# Patient Record
Sex: Female | Born: 1935 | Race: White | Hispanic: No | Marital: Single | Smoking: Never smoker
Health system: Southern US, Community
[De-identification: ages and names within clinical notes are randomized; demographics above are authoritative.]

## PROBLEM LIST (undated history)

## (undated) DIAGNOSIS — C801 Malignant (primary) neoplasm, unspecified: Secondary | ICD-10-CM

## (undated) DIAGNOSIS — I10 Essential (primary) hypertension: Secondary | ICD-10-CM

## (undated) HISTORY — PX: EYE SURGERY: SHX253

## (undated) HISTORY — PX: BREAST LUMPECTOMY: SHX2

---

## 2003-11-27 ENCOUNTER — Other Ambulatory Visit: Payer: Self-pay

## 2004-09-12 ENCOUNTER — Ambulatory Visit: Payer: Self-pay | Admitting: Oncology

## 2004-09-20 ENCOUNTER — Ambulatory Visit: Payer: Self-pay | Admitting: Oncology

## 2004-10-01 ENCOUNTER — Ambulatory Visit: Payer: Self-pay | Admitting: Family Medicine

## 2005-01-03 ENCOUNTER — Ambulatory Visit: Payer: Self-pay | Admitting: Oncology

## 2005-03-14 ENCOUNTER — Ambulatory Visit: Payer: Self-pay | Admitting: Oncology

## 2005-03-27 ENCOUNTER — Emergency Department: Payer: Self-pay | Admitting: Unknown Physician Specialty

## 2005-09-10 ENCOUNTER — Ambulatory Visit: Payer: Self-pay | Admitting: Oncology

## 2006-01-06 ENCOUNTER — Ambulatory Visit: Payer: Self-pay | Admitting: Family Medicine

## 2007-01-12 ENCOUNTER — Ambulatory Visit: Payer: Self-pay | Admitting: Family Medicine

## 2008-01-20 ENCOUNTER — Ambulatory Visit: Payer: Self-pay | Admitting: Family Medicine

## 2009-02-02 ENCOUNTER — Ambulatory Visit: Payer: Self-pay | Admitting: Family Medicine

## 2010-02-06 ENCOUNTER — Ambulatory Visit: Payer: Self-pay | Admitting: Family Medicine

## 2010-02-13 ENCOUNTER — Ambulatory Visit: Payer: Self-pay | Admitting: Family Medicine

## 2011-02-11 ENCOUNTER — Ambulatory Visit: Payer: Self-pay | Admitting: Surgery

## 2011-03-06 ENCOUNTER — Ambulatory Visit: Payer: Self-pay | Admitting: Surgery

## 2011-03-19 ENCOUNTER — Ambulatory Visit: Payer: Self-pay | Admitting: Surgery

## 2011-04-01 ENCOUNTER — Ambulatory Visit: Payer: Self-pay | Admitting: Oncology

## 2011-04-04 ENCOUNTER — Ambulatory Visit: Payer: Self-pay | Admitting: Oncology

## 2011-04-12 ENCOUNTER — Ambulatory Visit: Payer: Self-pay | Admitting: Surgery

## 2011-04-19 ENCOUNTER — Ambulatory Visit: Payer: Self-pay | Admitting: Surgery

## 2011-04-21 ENCOUNTER — Ambulatory Visit: Payer: Self-pay | Admitting: Oncology

## 2011-04-22 LAB — PATHOLOGY REPORT

## 2011-05-22 ENCOUNTER — Ambulatory Visit: Payer: Self-pay | Admitting: Oncology

## 2011-11-07 ENCOUNTER — Ambulatory Visit: Payer: Self-pay | Admitting: Oncology

## 2011-11-07 LAB — CBC CANCER CENTER
Basophil #: 0 x10 3/mm (ref 0.0–0.1)
Basophil %: 0.5 %
Eosinophil %: 2.2 %
HCT: 45.1 % (ref 35.0–47.0)
HGB: 15.1 g/dL (ref 12.0–16.0)
Lymphocyte #: 1.6 x10 3/mm (ref 1.0–3.6)
MCH: 28.6 pg (ref 26.0–34.0)
MCV: 86 fL (ref 80–100)
Monocyte %: 8.7 %
Neutrophil #: 5.9 x10 3/mm (ref 1.4–6.5)
Platelet: 208 x10 3/mm (ref 150–440)
RDW: 13.9 % (ref 11.5–14.5)
WBC: 8.4 x10 3/mm (ref 3.6–11.0)

## 2011-11-07 LAB — COMPREHENSIVE METABOLIC PANEL
Albumin: 3.9 g/dL (ref 3.4–5.0)
Alkaline Phosphatase: 162 U/L — ABNORMAL HIGH (ref 50–136)
Bilirubin,Total: 0.8 mg/dL (ref 0.2–1.0)
Calcium, Total: 9 mg/dL (ref 8.5–10.1)
Creatinine: 1.07 mg/dL (ref 0.60–1.30)
EGFR (African American): >60
EGFR (Non-African Amer.): 51 — ABNORMAL LOW
Glucose: 95 mg/dL (ref 65–99)
Osmolality: 289 (ref 275–301)
SGPT (ALT): 17 U/L
Sodium: 143 mmol/L (ref 136–145)

## 2011-11-08 LAB — CANCER ANTIGEN 27.29: CA 27.29: 28.1 U/mL (ref 0.0–38.6)

## 2011-11-22 ENCOUNTER — Ambulatory Visit: Payer: Self-pay | Admitting: Oncology

## 2012-02-12 ENCOUNTER — Ambulatory Visit: Payer: Self-pay | Admitting: Oncology

## 2012-05-06 ENCOUNTER — Ambulatory Visit: Payer: Self-pay | Admitting: Oncology

## 2012-05-06 LAB — CBC CANCER CENTER
Basophil #: 0 x10 3/mm (ref 0.0–0.1)
Basophil %: 0.5 %
Eosinophil #: 0.2 x10 3/mm (ref 0.0–0.7)
HCT: 44.7 % (ref 35.0–47.0)
HGB: 14.6 g/dL (ref 12.0–16.0)
Lymphocyte #: 1.1 x10 3/mm (ref 1.0–3.6)
MCH: 27.9 pg (ref 26.0–34.0)
MCHC: 32.7 g/dL (ref 32.0–36.0)
MCV: 86 fL (ref 80–100)
Monocyte #: 0.3 x10 3/mm (ref 0.2–0.9)
Neutrophil #: 3.2 x10 3/mm (ref 1.4–6.5)

## 2012-05-06 LAB — COMPREHENSIVE METABOLIC PANEL
Albumin: 3.8 g/dL (ref 3.4–5.0)
Alkaline Phosphatase: 141 U/L — ABNORMAL HIGH (ref 50–136)
BUN: 19 mg/dL — ABNORMAL HIGH (ref 7–18)
Calcium, Total: 9.3 mg/dL (ref 8.5–10.1)
EGFR (African American): >60
EGFR (Non-African Amer.): 52 — ABNORMAL LOW
Glucose: 152 mg/dL — ABNORMAL HIGH (ref 65–99)
Potassium: 3.3 mmol/L — ABNORMAL LOW (ref 3.5–5.1)
SGOT(AST): 24 U/L (ref 15–37)
SGPT (ALT): 19 U/L
Sodium: 143 mmol/L (ref 136–145)
Total Protein: 7 g/dL (ref 6.4–8.2)

## 2012-05-21 ENCOUNTER — Ambulatory Visit: Payer: Self-pay | Admitting: Oncology

## 2013-02-05 ENCOUNTER — Ambulatory Visit: Payer: Self-pay | Admitting: Internal Medicine

## 2013-02-15 ENCOUNTER — Ambulatory Visit: Payer: Self-pay | Admitting: Internal Medicine

## 2013-02-15 LAB — CREATININE, SERUM
Creatinine: 0.91 mg/dL (ref 0.60–1.30)
EGFR (African American): >60
EGFR (Non-African Amer.): 55 — ABNORMAL LOW

## 2013-02-18 ENCOUNTER — Ambulatory Visit: Payer: Self-pay | Admitting: Oncology

## 2013-02-19 ENCOUNTER — Ambulatory Visit: Payer: Self-pay | Admitting: Oncology

## 2013-02-22 LAB — COMPREHENSIVE METABOLIC PANEL
Alkaline Phosphatase: 152 U/L — ABNORMAL HIGH (ref 50–136)
Anion Gap: 14 (ref 7–16)
BUN: 22 mg/dL — ABNORMAL HIGH (ref 7–18)
Bilirubin,Total: 0.7 mg/dL (ref 0.2–1.0)
Calcium, Total: 9.3 mg/dL (ref 8.5–10.1)
Chloride: 104 mmol/L (ref 98–107)
EGFR (African American): 52 — ABNORMAL LOW
EGFR (Non-African Amer.): 45 — ABNORMAL LOW
Glucose: 144 mg/dL — ABNORMAL HIGH (ref 65–99)
Osmolality: 293 (ref 275–301)
SGOT(AST): 24 U/L (ref 15–37)
SGPT (ALT): 18 U/L (ref 12–78)
Sodium: 144 mmol/L (ref 136–145)
Total Protein: 6.9 g/dL (ref 6.4–8.2)

## 2013-02-22 LAB — CBC CANCER CENTER
Basophil #: 0 x10 3/mm (ref 0.0–0.1)
Eosinophil #: 0.1 x10 3/mm (ref 0.0–0.7)
HCT: 41.4 % (ref 35.0–47.0)
HGB: 14.1 g/dL (ref 12.0–16.0)
Lymphocyte #: 1.4 x10 3/mm (ref 1.0–3.6)
Lymphocyte %: 24.7 %
MCHC: 34.1 g/dL (ref 32.0–36.0)
MCV: 84 fL (ref 80–100)
Monocyte %: 6.4 %
Neutrophil #: 3.7 x10 3/mm (ref 1.4–6.5)
Neutrophil %: 65.6 %
RBC: 4.94 10*6/uL (ref 3.80–5.20)
WBC: 5.6 x10 3/mm (ref 3.6–11.0)

## 2013-02-23 LAB — CANCER ANTIGEN 27.29: CA 27.29: 29.3 U/mL (ref 0.0–38.6)

## 2013-03-21 ENCOUNTER — Ambulatory Visit: Payer: Self-pay | Admitting: Oncology

## 2016-03-01 ENCOUNTER — Inpatient Hospital Stay
Admission: EM | Admit: 2016-03-01 | Discharge: 2016-03-05 | DRG: 481 | Disposition: A | Discharge status: Skilled Nursing Facility | Payer: Medicare Other | Attending: Internal Medicine | Admitting: Internal Medicine

## 2016-03-01 ENCOUNTER — Encounter: Payer: Self-pay | Admitting: Emergency Medicine

## 2016-03-01 ENCOUNTER — Emergency Department: Payer: Medicare Other

## 2016-03-01 DIAGNOSIS — Z859 Personal history of malignant neoplasm, unspecified: Secondary | ICD-10-CM | POA: Diagnosis not present

## 2016-03-01 DIAGNOSIS — I10 Essential (primary) hypertension: Secondary | ICD-10-CM | POA: Diagnosis present

## 2016-03-01 DIAGNOSIS — Z8249 Family history of ischemic heart disease and other diseases of the circulatory system: Secondary | ICD-10-CM | POA: Diagnosis not present

## 2016-03-01 DIAGNOSIS — S72141A Displaced intertrochanteric fracture of right femur, initial encounter for closed fracture: Principal | ICD-10-CM | POA: Diagnosis present

## 2016-03-01 DIAGNOSIS — F419 Anxiety disorder, unspecified: Secondary | ICD-10-CM | POA: Diagnosis present

## 2016-03-01 DIAGNOSIS — D62 Acute posthemorrhagic anemia: Secondary | ICD-10-CM

## 2016-03-01 DIAGNOSIS — M7989 Other specified soft tissue disorders: Secondary | ICD-10-CM

## 2016-03-01 DIAGNOSIS — Z9889 Other specified postprocedural states: Secondary | ICD-10-CM

## 2016-03-01 DIAGNOSIS — M810 Age-related osteoporosis without current pathological fracture: Secondary | ICD-10-CM | POA: Diagnosis present

## 2016-03-01 DIAGNOSIS — Z853 Personal history of malignant neoplasm of breast: Secondary | ICD-10-CM

## 2016-03-01 DIAGNOSIS — W010XXA Fall on same level from slipping, tripping and stumbling without subsequent striking against object, initial encounter: Secondary | ICD-10-CM | POA: Diagnosis present

## 2016-03-01 DIAGNOSIS — Y92019 Unspecified place in single-family (private) house as the place of occurrence of the external cause: Secondary | ICD-10-CM | POA: Diagnosis not present

## 2016-03-01 DIAGNOSIS — S72001A Fracture of unspecified part of neck of right femur, initial encounter for closed fracture: Secondary | ICD-10-CM

## 2016-03-01 DIAGNOSIS — Z9289 Personal history of other medical treatment: Secondary | ICD-10-CM

## 2016-03-01 DIAGNOSIS — S022XXA Fracture of nasal bones, initial encounter for closed fracture: Secondary | ICD-10-CM

## 2016-03-01 DIAGNOSIS — R000 Tachycardia, unspecified: Secondary | ICD-10-CM

## 2016-03-01 DIAGNOSIS — S72009A Fracture of unspecified part of neck of unspecified femur, initial encounter for closed fracture: Secondary | ICD-10-CM

## 2016-03-01 DIAGNOSIS — Z79899 Other long term (current) drug therapy: Secondary | ICD-10-CM | POA: Diagnosis not present

## 2016-03-01 DIAGNOSIS — W19XXXA Unspecified fall, initial encounter: Secondary | ICD-10-CM

## 2016-03-01 DIAGNOSIS — R609 Edema, unspecified: Secondary | ICD-10-CM

## 2016-03-01 HISTORY — DX: Essential (primary) hypertension: I10

## 2016-03-01 HISTORY — DX: Malignant (primary) neoplasm, unspecified: C80.1

## 2016-03-01 LAB — COMPREHENSIVE METABOLIC PANEL
ALT: 14 U/L (ref 14–54)
AST: 32 U/L (ref 15–41)
Albumin: 3.7 g/dL (ref 3.5–5.0)
Alkaline Phosphatase: 86 U/L (ref 38–126)
Anion gap: 9 (ref 5–15)
BUN: 28 mg/dL — ABNORMAL HIGH (ref 6–20)
CHLORIDE: 107 mmol/L (ref 101–111)
CO2: 26 mmol/L (ref 22–32)
CREATININE: 1.01 mg/dL — AB (ref 0.44–1.00)
Calcium: 8.8 mg/dL — ABNORMAL LOW (ref 8.9–10.3)
GFR, EST AFRICAN AMERICAN: 53 mL/min — AB (ref >60)
GFR, EST NON AFRICAN AMERICAN: 46 mL/min — AB (ref >60)
Glucose, Bld: 131 mg/dL — ABNORMAL HIGH (ref 65–99)
POTASSIUM: 3.4 mmol/L — AB (ref 3.5–5.1)
Sodium: 142 mmol/L (ref 135–145)
Total Bilirubin: 1 mg/dL (ref 0.3–1.2)
Total Protein: 5.9 g/dL — ABNORMAL LOW (ref 6.5–8.1)

## 2016-03-01 LAB — URINALYSIS COMPLETE WITH MICROSCOPIC (ARMC ONLY)
Bilirubin Urine: NEGATIVE
GLUCOSE, UA: NEGATIVE mg/dL
KETONES UR: NEGATIVE mg/dL
Leukocytes, UA: NEGATIVE
NITRITE: NEGATIVE
Protein, ur: NEGATIVE mg/dL
SPECIFIC GRAVITY, URINE: 1.012 (ref 1.005–1.030)
pH: 6 (ref 5.0–8.0)

## 2016-03-01 LAB — CBC WITH DIFFERENTIAL/PLATELET
Basophils Absolute: 0 10*3/uL (ref 0–0.1)
Basophils Relative: 0 %
Eosinophils Absolute: 0.1 10*3/uL (ref 0–0.7)
HCT: 37.5 % (ref 35.0–47.0)
Hemoglobin: 12.6 g/dL (ref 12.0–16.0)
LYMPHS ABS: 0.6 10*3/uL — AB (ref 1.0–3.6)
MCH: 30.1 pg (ref 26.0–34.0)
MCHC: 33.6 g/dL (ref 32.0–36.0)
MCV: 89.6 fL (ref 80.0–100.0)
MONO ABS: 0.6 10*3/uL (ref 0.2–0.9)
Neutro Abs: 12.8 10*3/uL — ABNORMAL HIGH (ref 1.4–6.5)
Neutrophils Relative %: 90 %
PLATELETS: 160 10*3/uL (ref 150–440)
RBC: 4.18 MIL/uL (ref 3.80–5.20)
RDW: 13.7 % (ref 11.5–14.5)
WBC: 14.1 10*3/uL — ABNORMAL HIGH (ref 3.6–11.0)

## 2016-03-01 LAB — ABO/RH: ABO/RH(D): O POS

## 2016-03-01 LAB — APTT: APTT: 27 s (ref 24–36)

## 2016-03-01 LAB — SURGICAL PCR SCREEN
MRSA, PCR: NEGATIVE
Staphylococcus aureus: NEGATIVE

## 2016-03-01 LAB — PROTIME-INR
INR: 0.89
PROTHROMBIN TIME: 12.3 s (ref 11.4–15.0)

## 2016-03-01 MED ORDER — ACETAMINOPHEN 325 MG PO TABS: 650.0000 mg | ORAL_TABLET | Freq: Four times a day (QID) | ORAL | Status: DC | PRN

## 2016-03-01 MED ORDER — FENTANYL CITRATE (PF) 100 MCG/2ML IJ SOLN
50.0000 ug | Freq: Once | INTRAMUSCULAR | Status: AC
  Administered 2016-03-01: 50 ug via INTRAVENOUS
  Filled 2016-03-01: qty 2

## 2016-03-01 MED ORDER — PNEUMOCOCCAL VAC POLYVALENT 25 MCG/0.5ML IJ INJ: 0.5000 mL | INJECTION | INTRAMUSCULAR | Status: DC

## 2016-03-01 MED ORDER — ONDANSETRON HCL 4 MG/2ML IJ SOLN: 4.0000 mg | Freq: Four times a day (QID) | INTRAMUSCULAR | Status: DC | PRN

## 2016-03-01 MED ORDER — IBUPROFEN 400 MG PO TABS: 200.0000 mg | ORAL_TABLET | ORAL | Status: DC | PRN

## 2016-03-01 MED ORDER — ONDANSETRON HCL 4 MG PO TABS: 4.0000 mg | ORAL_TABLET | Freq: Four times a day (QID) | ORAL | Status: DC | PRN

## 2016-03-01 MED ORDER — ANASTROZOLE 1 MG PO TABS
1.0000 mg | ORAL_TABLET | Freq: Every day | ORAL | Status: DC
  Filled 2016-03-01: qty 1

## 2016-03-01 MED ORDER — AMLODIPINE BESYLATE 5 MG PO TABS
2.5000 mg | ORAL_TABLET | Freq: Every day | ORAL | Status: DC
  Administered 2016-03-03: 2.5 mg via ORAL
  Filled 2016-03-01: qty 1

## 2016-03-01 MED ORDER — OXYCODONE HCL 5 MG PO TABS: 5.0000 mg | ORAL_TABLET | ORAL | Status: DC | PRN

## 2016-03-01 MED ORDER — MORPHINE SULFATE (PF) 2 MG/ML IV SOLN
2.0000 mg | Freq: Once | INTRAVENOUS | Status: AC
  Administered 2016-03-01: 2 mg via INTRAVENOUS
  Filled 2016-03-01: qty 1

## 2016-03-01 MED ORDER — CEFAZOLIN SODIUM-DEXTROSE 2-4 GM/100ML-% IV SOLN
2.0000 g | INTRAVENOUS | Status: DC
  Filled 2016-03-01: qty 100

## 2016-03-01 MED ORDER — ACETAMINOPHEN 650 MG RE SUPP: 650.0000 mg | Freq: Four times a day (QID) | RECTAL | Status: DC | PRN

## 2016-03-01 MED ORDER — OXYCODONE HCL 5 MG PO TABS
5.0000 mg | ORAL_TABLET | ORAL | Status: DC | PRN
  Administered 2016-03-01 – 2016-03-02 (×2): 5 mg via ORAL
  Filled 2016-03-01 (×3): qty 1

## 2016-03-01 MED ORDER — DOCUSATE SODIUM 100 MG PO CAPS
100.0000 mg | ORAL_CAPSULE | Freq: Two times a day (BID) | ORAL | Status: DC
  Administered 2016-03-01 (×2): 100 mg via ORAL
  Filled 2016-03-01 (×2): qty 1

## 2016-03-01 MED ORDER — VITAMIN D (ERGOCALCIFEROL) 1.25 MG (50000 UNIT) PO CAPS
50000.0000 [IU] | ORAL_CAPSULE | ORAL | Status: DC
  Filled 2016-03-01: qty 1

## 2016-03-01 MED ORDER — ALPRAZOLAM 0.25 MG PO TABS
0.2500 mg | ORAL_TABLET | Freq: Every evening | ORAL | Status: DC | PRN
  Administered 2016-03-01 – 2016-03-03 (×3): 0.25 mg via ORAL
  Filled 2016-03-01 (×3): qty 1

## 2016-03-01 MED ORDER — POTASSIUM CHLORIDE 20 MEQ PO PACK
40.0000 meq | PACK | Freq: Once | ORAL | Status: AC
  Administered 2016-03-01: 40 meq via ORAL
  Filled 2016-03-01: qty 2

## 2016-03-01 MED ORDER — MORPHINE SULFATE (PF) 2 MG/ML IV SOLN: 2.0000 mg | INTRAVENOUS | Status: DC | PRN

## 2016-03-01 NOTE — ED Notes (Signed)
Report given to Kim, RN.

## 2016-03-01 NOTE — H&P (Signed)
[place] [facility] [place] NAME: Jasmine Stokes    MR#:  [id number]  DATE OF BIRTH:  1918-12-11  DATE OF ADMISSION:  03/01/2016  PRIMARY CARE PHYSICIAN: No primary care provider on file.   REQUESTING/REFERRING PHYSICIAN: Harvest Dark, MD  CHIEF COMPLAINT:  fall  HISTORY OF PRESENT ILLNESS:  Jasmine Stokes  is a 82 y.o. female with a known history of hypertension brought into the emergency department after a fall. According to the patient she tripped this morning falling onto her right side hitting her head. Does not believe she lost consciousness. Patient was unable to stand due to pain in the right hip. Patient also had bleeding from both nostrils, and bruising to her face. Xray has revealed nasal bone fracture. Patient lives alone and takes care of herself. Right hip x-ray with a right intertrochanteric fracture  PAST MEDICAL HISTORY:   Past Medical History  Diagnosis Date  . Hypertension   . Cancer [facility])     PAST SURGICAL HISTOIRY:   Past Surgical History  Procedure Laterality Date  . Breast lumpectomy      bilateral  . Eye surgery      SOCIAL HISTORY:   Social History  Substance Use Topics  . Smoking status: Never Smoker   . Smokeless tobacco: Not on file  . Alcohol Use: No    FAMILY HISTORY:  htn  DRUG ALLERGIES:  No Known Allergies  REVIEW OF SYSTEMS:  CONSTITUTIONAL: No fever, fatigue or weakness EYES: No blurred or double vision.  EARS, NOSE, AND THROAT: No tinnitus or ear pain.  RESPIRATORY: No cough, shortness of breath, wheezing or hemoptysis.  CARDIOVASCULAR: No chest pain, orthopnea, edema.  GASTROINTESTINAL: No nausea, vomiting, diarrhea or abdominal pain.  GENITOURINARY: No dysuria, hematuria.  ENDOCRINE: No polyuria, nocturia,  HEMATOLOGY: No anemia, easy bruising or bleeding SKIN: No rash or lesion. Bruises on her face MUSCULOSKELETAL: Right hip pain No joint pain or arthritis.   NEUROLOGIC:  No tingling, numbness, weakness.  PSYCHIATRY: No anxiety or depression.   MEDICATIONS AT HOME:   Prior to Admission medications   Medication Sig Start Date End Date Taking? Authorizing Provider  ALPRAZolam Duanne Moron) 0.25 MG tablet Take 1-2 tablets by mouth at bedtime as needed. 12/18/15  Yes Historical Provider, MD  amLODipine (NORVASC) 2.5 MG tablet Take 1 tablet by mouth daily. 02/12/16  Yes Historical Provider, MD  anastrozole (ARIMIDEX) 1 MG tablet Take 1 tablet by mouth daily. Reported on 03/01/2016    Historical Provider, MD  ibuprofen (ADVIL,MOTRIN) 200 MG tablet Take 1 tablet by mouth every 6 (six) hours as needed. Reported on 03/01/2016    Historical Provider, MD  Vitamin D, Ergocalciferol, (DRISDOL) 50000 units CAPS capsule Take 1 capsule by mouth once a week. Reported on 03/01/2016    Historical Provider, MD      VITAL SIGNS:  Blood pressure 153/98, pulse 101, temperature 98.2 F (36.8 C), temperature source Axillary, resp. rate 20, height 5' (1.524 m), weight 39.917 kg (88 lb), SpO2 99 %.  PHYSICAL EXAMINATION:  GENERAL:  82 y.o.-year-old patient lying in the bed with no acute distress. Patient is hard of hearing EYES: Pupils equal, round, reactive to light and accommodation. No scleral icterus. Extraocular muscles intact.  HEENT: Head atraumatic, normocephalic. Oropharynx and nasopharynx clear.  NECK:  Supple, no jugular venous distention. No thyroid enlargement, no tenderness.  LUNGS: Normal breath sounds bilaterally, no wheezing, rales,rhonchi or crepitation. No use of accessory muscles of respiration.  CARDIOVASCULAR: S1, S2 normal. No murmurs, rubs, or gallops.  ABDOMEN: Soft, nontender, nondistended. Bowel sounds present. No organomegaly or mass.  EXTREMITIES: Right hip is tender to touch, abducted and internally rotated .No pedal edema, cyanosis, or clubbing.  NEUROLOGIC: Cranial nerves II through XII are intact. Muscle strength 5/5 in all extremities except right lower  extremity Sensation intact. Gait not checked.  PSYCHIATRIC: The patient is alert and oriented x 3.  SKIN: No obvious rash, lesion, or ulcer.   LABORATORY PANEL:   CBC  Recent Labs Lab 03/01/16 0957  WBC 14.1*  HGB 12.6  HCT 37.5  PLT 160   ------------------------------------------------------------------------------------------------------------------  Chemistries   Recent Labs Lab 03/01/16 0957  NA 142  K 3.4*  CL 107  CO2 26  GLUCOSE 131*  BUN 28*  CREATININE 1.01*  CALCIUM 8.8*  AST 32  ALT 14  ALKPHOS 86  BILITOT 1.0   ------------------------------------------------------------------------------------------------------------------  Cardiac Enzymes No results for input(s): TROPONINI in the last 168 hours. ------------------------------------------------------------------------------------------------------------------  RADIOLOGY:  Dg Chest 1 View  03/01/2016  CLINICAL DATA:  Pain following fall EXAM: CHEST 1 VIEW COMPARISON:  None. FINDINGS: No edema or consolidation. Heart size and pulmonary vascularity are normal. No adenopathy. There is atherosclerotic calcification in the aorta. No pneumothorax. No fractures evident. Bones osteoporotic. There is calcification in each carotid artery. IMPRESSION: No edema or consolidation. No pneumothorax. Areas of carotid artery and thoracic aortic calcification. Bones osteoporotic. Electronically Signed   By: Lowella Grip III M.D.   On: 03/01/2016 10:37   Ct Head Wo Contrast  03/01/2016  CLINICAL DATA:  Status post fall this morning with a blow to the head. The patient's walker slipped out from under her. Initial encounter. EXAM: CT HEAD WITHOUT CONTRAST CT MAXILLOFACIAL WITHOUT CONTRAST CT CERVICAL SPINE WITHOUT CONTRAST TECHNIQUE: Multidetector CT imaging of the head, cervical spine, and maxillofacial structures were performed using the standard protocol without intravenous contrast. Multiplanar CT image reconstructions  of the cervical spine and maxillofacial structures were also generated. COMPARISON:  Head CT scan 02/05/2013. FINDINGS: CT HEAD FINDINGS Cortical atrophy and chronic microvascular ischemic change are seen. No evidence of acute intracranial abnormality including hemorrhage, infarct, midline shift or abnormal extra-axial fluid collection. Calcified extra-axial lesion over the high right frontal convexities measuring 0.4 cm in diameter is unchanged and may be a tiny meningioma. No hydrocephalus or pneumocephalus. The calvarium is intact. CT MAXILLOFACIAL FINDINGS The patient has a minimally depressed right nasal bone fracture. There is also minimally depressed fracture of the anterior wall of the right maxillary sinus. No other fracture is identified. There is a tiny amount of hemorrhage in the right maxillary sinus. Mucosal thickening right sphenoid sinus is noted. Mandibular condyles are located. The patient is status post bilateral lens extraction. The globes are intact and orbital fat is clear. Soft tissue contusion over the right maxilla is identified. CT CERVICAL SPINE FINDINGS No fracture or malalignment cervical spine is identified. Mild loss of disc space height is seen at C4-5 and C6-7. Lung apices are clear. IMPRESSION: Minimally depressed fractures of the right nasal bone in the anterior wall of the right maxillary sinus. Soft tissue contusion over the right maxilla and tiny amount of hemorrhage in the right maxillary sinus are identified. No acute abnormality head or cervical spine. Atrophy and chronic microvascular ischemic change. Electronically Signed   By: Inge Rise M.D.   On: 03/01/2016 11:06   Ct Cervical Spine Wo Contrast  03/01/2016  CLINICAL DATA:  Status post fall  this morning with a blow to the head. The patient's walker slipped out from under her. Initial encounter. EXAM: CT HEAD WITHOUT CONTRAST CT MAXILLOFACIAL WITHOUT CONTRAST CT CERVICAL SPINE WITHOUT CONTRAST TECHNIQUE:  Multidetector CT imaging of the head, cervical spine, and maxillofacial structures were performed using the standard protocol without intravenous contrast. Multiplanar CT image reconstructions of the cervical spine and maxillofacial structures were also generated. COMPARISON:  Head CT scan 02/05/2013. FINDINGS: CT HEAD FINDINGS Cortical atrophy and chronic microvascular ischemic change are seen. No evidence of acute intracranial abnormality including hemorrhage, infarct, midline shift or abnormal extra-axial fluid collection. Calcified extra-axial lesion over the high right frontal convexities measuring 0.4 cm in diameter is unchanged and may be a tiny meningioma. No hydrocephalus or pneumocephalus. The calvarium is intact. CT MAXILLOFACIAL FINDINGS The patient has a minimally depressed right nasal bone fracture. There is also minimally depressed fracture of the anterior wall of the right maxillary sinus. No other fracture is identified. There is a tiny amount of hemorrhage in the right maxillary sinus. Mucosal thickening right sphenoid sinus is noted. Mandibular condyles are located. The patient is status post bilateral lens extraction. The globes are intact and orbital fat is clear. Soft tissue contusion over the right maxilla is identified. CT CERVICAL SPINE FINDINGS No fracture or malalignment cervical spine is identified. Mild loss of disc space height is seen at C4-5 and C6-7. Lung apices are clear. IMPRESSION: Minimally depressed fractures of the right nasal bone in the anterior wall of the right maxillary sinus. Soft tissue contusion over the right maxilla and tiny amount of hemorrhage in the right maxillary sinus are identified. No acute abnormality head or cervical spine. Atrophy and chronic microvascular ischemic change. Electronically Signed   By: Inge Rise M.D.   On: 03/01/2016 11:06   Dg Hip Unilat  With Pelvis 2-3 Views Right  03/01/2016  CLINICAL DATA:  Pain following fall EXAM: DG HIP  (WITH OR WITHOUT PELVIS) 2-3V RIGHT COMPARISON:  None. FINDINGS: Frontal pelvis as well as frontal and lateral right hip images were obtained. There is a comminuted intertrochanteric femur fracture on the right with varus angulation fracture site. There is avulsion of the lesser trochanter. There is impaction at the fracture site with multiple displaced fracture fragments throughout the intertrochanteric region. No other fractures. No dislocation. There is mild symmetric narrowing of both hip joints. Bones are diffusely osteoporotic. IMPRESSION: Comminuted intertrochanteric femur fracture on the right with displaced fracture fragments as well as impaction and varus angulation at the fracture site. No other fractures. No dislocations. Narrowing both hip joints, symmetric. Bones diffusely osteoporotic. Electronically Signed   By: Lowella Grip III M.D.   On: 03/01/2016 10:36   Ct Maxillofacial Wo Cm  03/01/2016  CLINICAL DATA:  Status post fall this morning with a blow to the head. The patient's walker slipped out from under her. Initial encounter. EXAM: CT HEAD WITHOUT CONTRAST CT MAXILLOFACIAL WITHOUT CONTRAST CT CERVICAL SPINE WITHOUT CONTRAST TECHNIQUE: Multidetector CT imaging of the head, cervical spine, and maxillofacial structures were performed using the standard protocol without intravenous contrast. Multiplanar CT image reconstructions of the cervical spine and maxillofacial structures were also generated. COMPARISON:  Head CT scan 02/05/2013. FINDINGS: CT HEAD FINDINGS Cortical atrophy and chronic microvascular ischemic change are seen. No evidence of acute intracranial abnormality including hemorrhage, infarct, midline shift or abnormal extra-axial fluid collection. Calcified extra-axial lesion over the high right frontal convexities measuring 0.4 cm in diameter is unchanged and may be a tiny meningioma.  No hydrocephalus or pneumocephalus. The calvarium is intact. CT MAXILLOFACIAL FINDINGS The  patient has a minimally depressed right nasal bone fracture. There is also minimally depressed fracture of the anterior wall of the right maxillary sinus. No other fracture is identified. There is a tiny amount of hemorrhage in the right maxillary sinus. Mucosal thickening right sphenoid sinus is noted. Mandibular condyles are located. The patient is status post bilateral lens extraction. The globes are intact and orbital fat is clear. Soft tissue contusion over the right maxilla is identified. CT CERVICAL SPINE FINDINGS No fracture or malalignment cervical spine is identified. Mild loss of disc space height is seen at C4-5 and C6-7. Lung apices are clear. IMPRESSION: Minimally depressed fractures of the right nasal bone in the anterior wall of the right maxillary sinus. Soft tissue contusion over the right maxilla and tiny amount of hemorrhage in the right maxillary sinus are identified. No acute abnormality head or cervical spine. Atrophy and chronic microvascular ischemic change. Electronically Signed   By: Inge Rise M.D.   On: 03/01/2016 11:06    EKG:   Orders placed or performed during the hospital encounter of 03/01/16  . ED EKG  . ED EKG    IMPRESSION AND PLAN:   Jasmine Stokes  is a 82 y.o. female with a known history of hypertension brought into the emergency department after a fall. According to the patient she tripped this morning falling onto her right side hitting her head. Does not believe she lost consciousness. Patient was unable to stand due to pain in the right hip. Patient also had bleeding from both nostrils, and bruising to her face. Xray has revealed nasal bone fracture. Patient lives alone and takes care of herself. Right hip x-ray with a right intertrochanteric fracture  #Right hip intertrochanteric fracture Medically optimized for surgery, patient did not have any cardiac history, lives alone and takes care of herself Orthopedics consult is placed and discussed with Dr.  Mack Guise, might consider doing surgery in a.m. Pain management as needed PT evaluation after surgery  #Essential hypertension Resume home medication amlodipine and titrate as needed  #History of anxiety resume home medications Xanax  #Nasal bone fracture from the fall Pain management as needed  #History of breast cancer status post lumpectomy currently under remission no interventions needed at this time      All the records are reviewed and case discussed with ED provider. Management plans discussed with the patient, family and they are in agreement.  CODE STATUS: [id number]  TOTAL TIME TAKING CARE OF THIS PATIENT: 45 minutes.    Nicholes Mango M.D on 03/01/2016 at 2:43 PM  Between 7am to 6pm - Pager - [phone]  After 6pm go to [email or web address] - password [email or web address] [facility]  [facility]  Office  [phone]  CC: Primary care physician; No primary care provider on file.

## 2016-03-01 NOTE — ED Provider Notes (Signed)
[facility] Emergency Department Provider Note  Time seen: 9:57 AM  I have reviewed the triage vital signs and the nursing notes.   HISTORY  Chief Complaint Fall    HPI Jasmine Stokes is a 82 y.o. female with a past medical history of hypertension who presents the emergency department after a fall. According to the patient she tripped this morning falling onto her right side hitting her head. Does not believe she lost consciousness. Patient was unable to stand due to pain in the right hip. Patient also had bleeding from both nostrils, and bruising to her face. Patient was able to crawl to a phone to call 911 to bring her to the emergency department.     Past Medical History  Diagnosis Date  . Hypertension   . Cancer ([place])     There are no active problems to display for this patient.   Past Surgical History  Procedure Laterality Date  . Breast lumpectomy      bilateral  . Eye surgery      No current outpatient prescriptions on file.  Allergies Review of patient's allergies indicates no known allergies.  No family history on file.  Social History Social History  Substance Use Topics  . Smoking status: Never Smoker   . Smokeless tobacco: None  . Alcohol Use: No    Review of Systems Constitutional: Negative for fever. Eyes: Negative for visual changes. ENT: Positive for blood from the nose earlier which has resolved Cardiovascular: Negative for chest pain. Respiratory: Negative for shortness of breath. Gastrointestinal: Negative for abdominal pain Musculoskeletal: Right hip pain. Skin: Ecchymosis to the face. Neurological: Negative for headache. Denies focal weakness or numbness. 10-point ROS otherwise negative.  ____________________________________________   PHYSICAL EXAM:  VITAL SIGNS: ED Triage Vitals  Enc Vitals Group     BP 03/01/16 0944 172/67 mmHg     Pulse Rate 03/01/16 0944 96     Resp --      Temp 03/01/16 0944  98.4 F (36.9 C)     Temp Source 03/01/16 0944 Oral     SpO2 03/01/16 0944 98 %     Weight 03/01/16 0944 88 lb (39.917 kg)     Height 03/01/16 0944 5' (1.524 m)     Head Cir --      Peak Flow --      Pain Score --      Pain Loc --      Pain Edu? --      Excl. in [place]? --     Constitutional: Alert and oriented. Well appearing and in no distress. Eyes: Mild right periorbital ecchymosis, EOMI. ENT   Head: Ecchymosis to forehead, right periorbital area and nose. Dried blood in bilateral nostrils, no septal hematoma or active bleed.   Mouth/Throat: Mucous membranes are moist. Cardiovascular: Normal rate, regular rhythm. No murmur Respiratory: Normal respiratory effort without tachypnea nor retractions. Breath sounds are clear  Gastrointestinal: Soft and nontender. No distention. Musculoskeletal: Shortness of extremity rotated right lower extremity, with moderate right hip tenderness to palpation. Left hip and lower extremity. Normal. Neurovascular intact bilaterally.Sensation intact. 2+ DP pulse. Neurologic:  Normal speech and language. No gross focal neurologic deficits. Sensation intact in all extremities. Right lower extremity unable to test due to pain, likely fracture. Skin:  Skin is warm, dry  Psychiatric: Mood and affect are normal.  ____________________________________________    EKG  EKG reviewed and interpreted by myself shows normal sinus rhythm at 94 bpm, narrow  QRS, normal axis, normal intervals, nonspecific but no concerning ST changes.  ____________________________________________    RADIOLOGY  X-ray consistent with right intertrochanteric femur fracture.  ____________________________________________    INITIAL IMPRESSION / ASSESSMENT AND PLAN / ED COURSE  Pertinent labs & imaging results that were available during my care of the patient were reviewed by me and considered in my medical decision making (see chart for details).  Patient presents the  emergency department after a fall. Exams consistent with right hip fracture. We will check labs, x-rays, CT head/face/neck due to fall and facial ecchymosis. We will check preop labs as well as a preop chest x-ray and EKG. Patient denies any pain when lying still, but states significant pain with any attempted movement of the right lower extremity.   X-ray consistent with right intertrochanteric femur fracture. We'll discuss with orthopedics and admit. Foley catheter placed. Labs are largely within normal limits. ____________________________________________   FINAL CLINICAL IMPRESSION(S) / ED DIAGNOSES  Cheral Marker, MD 03/01/16 1110

## 2016-03-01 NOTE — ED Notes (Signed)
Patient brought in by [facility] from home for a fall this morning, patient states that her walked slide out from under her and she fell hitting her head. Patient has shortening and rotation of the right leg. Patient denies LOC, has hematoma on the right cheek.

## 2016-03-01 NOTE — Consult Note (Addendum)
ORTHOPAEDIC CONSULTATION  REQUESTING PHYSICIAN: Nicholes Mango, MD  Chief Complaint: Right hip pain status post fall  HPI: Jasmine Stokes is a 82 y.o. female who complains of  pain in the right hip after fall at home today. She states she was using her walker and coming out of her 10. The walker, had of her causing her to fall. She was unable to stand or ambulate following this injury. She is brought to the [facility] emergency Department where x-rays were taken and revealed a three-part displaced intertrochanteric hip fracture. Patient was admitted to the hospitalist service for preoperative clearance. She is seen in her hospital room today with family and friends at the bedside. She currently states she is comfortable. She denies any other injuries.  Patient was independent at baseline. CT scan of the head demonstrated fractures of the nasal bone and right axillary sinus.  Past Medical History  Diagnosis Date  . Hypertension   . Cancer [facility])    Past Surgical History  Procedure Laterality Date  . Breast lumpectomy      bilateral  . Eye surgery     Social History   Social History  . Marital Status: Single    Spouse Name: N/A  . Number of Children: N/A  . Years of Education: N/A   Social History Main Topics  . Smoking status: Never Smoker   . Smokeless tobacco: None  . Alcohol Use: No  . Drug Use: No  . Sexual Activity: Not Asked   Other Topics Concern  . None   Social History Narrative  . None   No family history on file. No Known Allergies Prior to Admission medications   Medication Sig Start Date End Date Taking? Authorizing Provider  ALPRAZolam Duanne Moron) 0.25 MG tablet Take 1-2 tablets by mouth at bedtime as needed. 12/18/15  Yes Historical Provider, MD  amLODipine (NORVASC) 2.5 MG tablet Take 1 tablet by mouth daily. 02/12/16  Yes Historical Provider, MD  anastrozole (ARIMIDEX) 1 MG tablet Take 1 tablet by mouth daily. Reported on 03/01/2016    Historical  Provider, MD  ibuprofen (ADVIL,MOTRIN) 200 MG tablet Take 1 tablet by mouth every 6 (six) hours as needed. Reported on 03/01/2016    Historical Provider, MD  Vitamin D, Ergocalciferol, (DRISDOL) 50000 units CAPS capsule Take 1 capsule by mouth once a week. Reported on 03/01/2016    Historical Provider, MD   Dg Chest 1 View  03/01/2016  CLINICAL DATA:  Pain following fall EXAM: CHEST 1 VIEW COMPARISON:  None. FINDINGS: No edema or consolidation. Heart size and pulmonary vascularity are normal. No adenopathy. There is atherosclerotic calcification in the aorta. No pneumothorax. No fractures evident. Bones osteoporotic. There is calcification in each carotid artery. IMPRESSION: No edema or consolidation. No pneumothorax. Areas of carotid artery and thoracic aortic calcification. Bones osteoporotic. Electronically Signed   By: Lowella Grip III M.D.   On: 03/01/2016 10:37   Ct Head Wo Contrast  03/01/2016  CLINICAL DATA:  Status post fall this morning with a blow to the head. The patient's walker slipped out from under her. Initial encounter. EXAM: CT HEAD WITHOUT CONTRAST CT MAXILLOFACIAL WITHOUT CONTRAST CT CERVICAL SPINE WITHOUT CONTRAST TECHNIQUE: Multidetector CT imaging of the head, cervical spine, and maxillofacial structures were performed using the standard protocol without intravenous contrast. Multiplanar CT image reconstructions of the cervical spine and maxillofacial structures were also generated. COMPARISON:  Head CT scan 02/05/2013. FINDINGS: CT HEAD FINDINGS Cortical atrophy and chronic microvascular ischemic change are  seen. No evidence of acute intracranial abnormality including hemorrhage, infarct, midline shift or abnormal extra-axial fluid collection. Calcified extra-axial lesion over the high right frontal convexities measuring 0.4 cm in diameter is unchanged and may be a tiny meningioma. No hydrocephalus or pneumocephalus. The calvarium is intact. CT MAXILLOFACIAL FINDINGS The patient  has a minimally depressed right nasal bone fracture. There is also minimally depressed fracture of the anterior wall of the right maxillary sinus. No other fracture is identified. There is a tiny amount of hemorrhage in the right maxillary sinus. Mucosal thickening right sphenoid sinus is noted. Mandibular condyles are located. The patient is status post bilateral lens extraction. The globes are intact and orbital fat is clear. Soft tissue contusion over the right maxilla is identified. CT CERVICAL SPINE FINDINGS No fracture or malalignment cervical spine is identified. Mild loss of disc space height is seen at C4-5 and C6-7. Lung apices are clear. IMPRESSION: Minimally depressed fractures of the right nasal bone in the anterior wall of the right maxillary sinus. Soft tissue contusion over the right maxilla and tiny amount of hemorrhage in the right maxillary sinus are identified. No acute abnormality head or cervical spine. Atrophy and chronic microvascular ischemic change. Electronically Signed   By: Inge Rise M.D.   On: 03/01/2016 11:06   Ct Cervical Spine Wo Contrast  03/01/2016  CLINICAL DATA:  Status post fall this morning with a blow to the head. The patient's walker slipped out from under her. Initial encounter. EXAM: CT HEAD WITHOUT CONTRAST CT MAXILLOFACIAL WITHOUT CONTRAST CT CERVICAL SPINE WITHOUT CONTRAST TECHNIQUE: Multidetector CT imaging of the head, cervical spine, and maxillofacial structures were performed using the standard protocol without intravenous contrast. Multiplanar CT image reconstructions of the cervical spine and maxillofacial structures were also generated. COMPARISON:  Head CT scan 02/05/2013. FINDINGS: CT HEAD FINDINGS Cortical atrophy and chronic microvascular ischemic change are seen. No evidence of acute intracranial abnormality including hemorrhage, infarct, midline shift or abnormal extra-axial fluid collection. Calcified extra-axial lesion over the high right frontal  convexities measuring 0.4 cm in diameter is unchanged and may be a tiny meningioma. No hydrocephalus or pneumocephalus. The calvarium is intact. CT MAXILLOFACIAL FINDINGS The patient has a minimally depressed right nasal bone fracture. There is also minimally depressed fracture of the anterior wall of the right maxillary sinus. No other fracture is identified. There is a tiny amount of hemorrhage in the right maxillary sinus. Mucosal thickening right sphenoid sinus is noted. Mandibular condyles are located. The patient is status post bilateral lens extraction. The globes are intact and orbital fat is clear. Soft tissue contusion over the right maxilla is identified. CT CERVICAL SPINE FINDINGS No fracture or malalignment cervical spine is identified. Mild loss of disc space height is seen at C4-5 and C6-7. Lung apices are clear. IMPRESSION: Minimally depressed fractures of the right nasal bone in the anterior wall of the right maxillary sinus. Soft tissue contusion over the right maxilla and tiny amount of hemorrhage in the right maxillary sinus are identified. No acute abnormality head or cervical spine. Atrophy and chronic microvascular ischemic change. Electronically Signed   By: Inge Rise M.D.   On: 03/01/2016 11:06   Dg Hip Unilat  With Pelvis 2-3 Views Right  03/01/2016  CLINICAL DATA:  Pain following fall EXAM: DG HIP (WITH OR WITHOUT PELVIS) 2-3V RIGHT COMPARISON:  None. FINDINGS: Frontal pelvis as well as frontal and lateral right hip images were obtained. There is a comminuted intertrochanteric femur fracture on the  right with varus angulation fracture site. There is avulsion of the lesser trochanter. There is impaction at the fracture site with multiple displaced fracture fragments throughout the intertrochanteric region. No other fractures. No dislocation. There is mild symmetric narrowing of both hip joints. Bones are diffusely osteoporotic. IMPRESSION: Comminuted intertrochanteric femur  fracture on the right with displaced fracture fragments as well as impaction and varus angulation at the fracture site. No other fractures. No dislocations. Narrowing both hip joints, symmetric. Bones diffusely osteoporotic. Electronically Signed   By: Lowella Grip III M.D.   On: 03/01/2016 10:36   Ct Maxillofacial Wo Cm  03/01/2016  CLINICAL DATA:  Status post fall this morning with a blow to the head. The patient's walker slipped out from under her. Initial encounter. EXAM: CT HEAD WITHOUT CONTRAST CT MAXILLOFACIAL WITHOUT CONTRAST CT CERVICAL SPINE WITHOUT CONTRAST TECHNIQUE: Multidetector CT imaging of the head, cervical spine, and maxillofacial structures were performed using the standard protocol without intravenous contrast. Multiplanar CT image reconstructions of the cervical spine and maxillofacial structures were also generated. COMPARISON:  Head CT scan 02/05/2013. FINDINGS: CT HEAD FINDINGS Cortical atrophy and chronic microvascular ischemic change are seen. No evidence of acute intracranial abnormality including hemorrhage, infarct, midline shift or abnormal extra-axial fluid collection. Calcified extra-axial lesion over the high right frontal convexities measuring 0.4 cm in diameter is unchanged and may be a tiny meningioma. No hydrocephalus or pneumocephalus. The calvarium is intact. CT MAXILLOFACIAL FINDINGS The patient has a minimally depressed right nasal bone fracture. There is also minimally depressed fracture of the anterior wall of the right maxillary sinus. No other fracture is identified. There is a tiny amount of hemorrhage in the right maxillary sinus. Mucosal thickening right sphenoid sinus is noted. Mandibular condyles are located. The patient is status post bilateral lens extraction. The globes are intact and orbital fat is clear. Soft tissue contusion over the right maxilla is identified. CT CERVICAL SPINE FINDINGS No fracture or malalignment cervical spine is identified. Mild  loss of disc space height is seen at C4-5 and C6-7. Lung apices are clear. IMPRESSION: Minimally depressed fractures of the right nasal bone in the anterior wall of the right maxillary sinus. Soft tissue contusion over the right maxilla and tiny amount of hemorrhage in the right maxillary sinus are identified. No acute abnormality head or cervical spine. Atrophy and chronic microvascular ischemic change. Electronically Signed   By: Inge Rise M.D.   On: 03/01/2016 11:06    Positive ROS: All other systems have been reviewed and were otherwise negative with the exception of those mentioned in the HPI and as above.  Physical Exam: General: Alert, no acute distress. Patient has a significant contusion over the right side of her face extending for head and periorbital area.  MUSCULOSKELETAL: Right hip: Patient's skin is intact. There is no erythema or ecchymosis or significant swelling. Her thigh and leg compartments are soft and compressible. She has palpable pedal pulses, intact sensation light touch and intact motor function. Patient flex and extend her toes and dorsiflex and plantarflex her ankle. She has mild shortening of the right hip by approximately a centimeter and minimal external rotation.  Assessment: Right comminuted, three-part, displaced intertrochanteric hip fracture  Plan: I explained to the patient and her family and friends that she has sustained a comminuted fracture of her right hip. She understands that is in 3 parts. She has significant osteoporosis on her x-rays as well. I have recommended to medullary fixation for her hip fracture in  an effort to give her a chance to stand and ambulate after this injury. I do not believe she will be able to ambulate without surgical intervention. She also understands that not every patient with this injury recurrence the standing or ambulating. I reviewed the details of the operation as well as the postoperative course.  I also discussed  with the patient the risks and benefits of surgery. We discussed the risks include infection, bleeding requiring blood transfusion, nerve or blood vessel injury, change in lower extremity leg lengths or rotation, malunion, nonunion, persistent pain, hardware failure or pullout of the hardware, AVN, osteoarthritis, failure to return to walking and the need for further surgery. The risks include but are not limited to DVT and pulmonary embolism, myocardial infarction, stroke, pneumonia, respiratory failure and death. I reviewed the patient's labs and radiographic studies in preparation for this case. I spoke with the hospitalist was cleared her for surgery. I have also spoke with Dr. Andree Elk from anesthesia who stated that he did not believe the patient needed medical clearance given her lack of a significant cardiac history. Patient scheduled for surgery in the morning. She will be nothing by mouth after midnight and should not receive any anticoagulants overnight.    Thornton Park, MD    03/01/2016 5:21 PM

## 2016-03-02 ENCOUNTER — Inpatient Hospital Stay: Payer: Medicare Other | Admitting: Certified Registered Nurse Anesthetist

## 2016-03-02 ENCOUNTER — Inpatient Hospital Stay: Payer: Medicare Other

## 2016-03-02 ENCOUNTER — Encounter
Admission: EM | Disposition: A | Discharge status: Skilled Nursing Facility | Payer: Self-pay | Source: Home / Self Care | Attending: Internal Medicine

## 2016-03-02 HISTORY — PX: INTRAMEDULLARY (IM) NAIL INTERTROCHANTERIC: SHX5875

## 2016-03-02 LAB — COMPREHENSIVE METABOLIC PANEL
ALK PHOS: 62 U/L (ref 38–126)
ALT: 14 U/L (ref 14–54)
AST: 31 U/L (ref 15–41)
Albumin: 3 g/dL — ABNORMAL LOW (ref 3.5–5.0)
Anion gap: 5 (ref 5–15)
BILIRUBIN TOTAL: 1.9 mg/dL — AB (ref 0.3–1.2)
BUN: 22 mg/dL — AB (ref 6–20)
CHLORIDE: 106 mmol/L (ref 101–111)
CO2: 28 mmol/L (ref 22–32)
CREATININE: 0.82 mg/dL (ref 0.44–1.00)
Calcium: 8.4 mg/dL — ABNORMAL LOW (ref 8.9–10.3)
GFR, EST NON AFRICAN AMERICAN: 59 mL/min — AB (ref >60)
GLUCOSE: 108 mg/dL — AB (ref 65–99)
POTASSIUM: 4.3 mmol/L (ref 3.5–5.1)
Sodium: 139 mmol/L (ref 135–145)
Total Protein: 5 g/dL — ABNORMAL LOW (ref 6.5–8.1)

## 2016-03-02 LAB — PROTIME-INR
INR: 0.99
Prothrombin Time: 13.3 seconds (ref 11.4–15.0)

## 2016-03-02 LAB — CBC
HCT: 29.5 % — ABNORMAL LOW (ref 35.0–47.0)
Hemoglobin: 9.8 g/dL — ABNORMAL LOW (ref 12.0–16.0)
MCH: 30.2 pg (ref 26.0–34.0)
MCHC: 33.3 g/dL (ref 32.0–36.0)
MCV: 90.5 fL (ref 80.0–100.0)
Platelets: 137 K/uL — ABNORMAL LOW (ref 150–440)
RBC: 3.26 MIL/uL — ABNORMAL LOW (ref 3.80–5.20)
RDW: 13.9 % (ref 11.5–14.5)
WBC: 5.9 K/uL (ref 3.6–11.0)

## 2016-03-02 SURGERY — FIXATION, FRACTURE, INTERTROCHANTERIC, WITH INTRAMEDULLARY ROD
Anesthesia: Spinal | Laterality: Right

## 2016-03-02 MED ORDER — PROPOFOL 500 MG/50ML IV EMUL
INTRAVENOUS | Status: DC | PRN
  Administered 2016-03-02: 30 ug/kg/min via INTRAVENOUS

## 2016-03-02 MED ORDER — CEFAZOLIN SODIUM-DEXTROSE 2-3 GM-% IV SOLR
INTRAVENOUS | Status: DC | PRN
  Administered 2016-03-02: 2 g via INTRAVENOUS

## 2016-03-02 MED ORDER — FENTANYL CITRATE (PF) 100 MCG/2ML IJ SOLN: 25.0000 ug | INTRAMUSCULAR | Status: DC | PRN

## 2016-03-02 MED ORDER — POLYETHYLENE GLYCOL 3350 17 G PO PACK: 17.0000 g | PACK | Freq: Every day | ORAL | Status: DC | PRN

## 2016-03-02 MED ORDER — SODIUM CHLORIDE 0.9 % IV SOLN
75.0000 mL/h | INTRAVENOUS | Status: DC
  Administered 2016-03-02 – 2016-03-03 (×2): 75 mL/h via INTRAVENOUS

## 2016-03-02 MED ORDER — ENOXAPARIN SODIUM 30 MG/0.3ML ~~LOC~~ SOLN
30.0000 mg | SUBCUTANEOUS | Status: DC
  Administered 2016-03-03 – 2016-03-05 (×3): 30 mg via SUBCUTANEOUS
  Filled 2016-03-02 (×3): qty 0.3

## 2016-03-02 MED ORDER — BISACODYL 10 MG RE SUPP: 10.0000 mg | Freq: Every day | RECTAL | Status: DC | PRN

## 2016-03-02 MED ORDER — DOCUSATE SODIUM 100 MG PO CAPS
100.0000 mg | ORAL_CAPSULE | Freq: Two times a day (BID) | ORAL | Status: DC
  Administered 2016-03-02 – 2016-03-04 (×5): 100 mg via ORAL
  Filled 2016-03-02 (×6): qty 1

## 2016-03-02 MED ORDER — MIDAZOLAM HCL 5 MG/5ML IJ SOLN
INTRAMUSCULAR | Status: DC | PRN
  Administered 2016-03-02: .5 mg via INTRAVENOUS

## 2016-03-02 MED ORDER — ALUM & MAG HYDROXIDE-SIMETH 200-200-20 MG/5ML PO SUSP: 30.0000 mL | ORAL | Status: DC | PRN

## 2016-03-02 MED ORDER — BUPIVACAINE HCL (PF) 0.5 % IJ SOLN
INTRAMUSCULAR | Status: DC | PRN
  Administered 2016-03-02: 2.5 mL via PERINEURAL

## 2016-03-02 MED ORDER — DEXAMETHASONE SODIUM PHOSPHATE 10 MG/ML IJ SOLN
INTRAMUSCULAR | Status: DC | PRN
  Administered 2016-03-02: 5 mg via INTRAVENOUS

## 2016-03-02 MED ORDER — ONDANSETRON HCL 4 MG/2ML IJ SOLN: 4.0000 mg | Freq: Once | INTRAMUSCULAR | Status: DC | PRN

## 2016-03-02 MED ORDER — ONDANSETRON HCL 4 MG/2ML IJ SOLN: 4.0000 mg | Freq: Four times a day (QID) | INTRAMUSCULAR | Status: DC | PRN

## 2016-03-02 MED ORDER — FERROUS SULFATE 325 (65 FE) MG PO TABS
325.0000 mg | ORAL_TABLET | Freq: Three times a day (TID) | ORAL | Status: DC
  Administered 2016-03-02 – 2016-03-05 (×8): 325 mg via ORAL
  Filled 2016-03-02 (×8): qty 1

## 2016-03-02 MED ORDER — MENTHOL 3 MG MT LOZG
1.0000 | LOZENGE | OROMUCOSAL | Status: DC | PRN
  Filled 2016-03-02: qty 9

## 2016-03-02 MED ORDER — ACETAMINOPHEN 325 MG PO TABS
650.0000 mg | ORAL_TABLET | Freq: Four times a day (QID) | ORAL | Status: DC | PRN
  Administered 2016-03-02 – 2016-03-05 (×6): 650 mg via ORAL
  Filled 2016-03-02 (×6): qty 2

## 2016-03-02 MED ORDER — PROPOFOL 10 MG/ML IV BOLUS
INTRAVENOUS | Status: DC | PRN
  Administered 2016-03-02: 10 mg via INTRAVENOUS

## 2016-03-02 MED ORDER — PHENYLEPHRINE HCL 10 MG/ML IJ SOLN
INTRAMUSCULAR | Status: DC | PRN
  Administered 2016-03-02 (×2): 100 ug via INTRAVENOUS
  Administered 2016-03-02: 200 ug via INTRAVENOUS
  Administered 2016-03-02: 100 ug via INTRAVENOUS

## 2016-03-02 MED ORDER — OXYCODONE HCL 5 MG PO TABS
5.0000 mg | ORAL_TABLET | ORAL | Status: DC | PRN
  Administered 2016-03-02 – 2016-03-03 (×5): 5 mg via ORAL
  Filled 2016-03-02 (×5): qty 1

## 2016-03-02 MED ORDER — LACTATED RINGERS IV SOLN
INTRAVENOUS | Status: DC | PRN
  Administered 2016-03-02: 10:00:00 via INTRAVENOUS

## 2016-03-02 MED ORDER — CEFAZOLIN SODIUM-DEXTROSE 2-4 GM/100ML-% IV SOLN
2.0000 g | Freq: Four times a day (QID) | INTRAVENOUS | Status: AC
  Administered 2016-03-02 (×2): 2 g via INTRAVENOUS
  Filled 2016-03-02 (×2): qty 100

## 2016-03-02 MED ORDER — SENNA 8.6 MG PO TABS
1.0000 | ORAL_TABLET | Freq: Two times a day (BID) | ORAL | Status: DC
  Administered 2016-03-02 – 2016-03-04 (×4): 8.6 mg via ORAL
  Filled 2016-03-02 (×6): qty 1

## 2016-03-02 MED ORDER — PHENOL 1.4 % MT LIQD
1.0000 | OROMUCOSAL | Status: DC | PRN
  Filled 2016-03-02: qty 177

## 2016-03-02 MED ORDER — ACETAMINOPHEN 650 MG RE SUPP: 650.0000 mg | Freq: Four times a day (QID) | RECTAL | Status: DC | PRN

## 2016-03-02 MED ORDER — ONDANSETRON HCL 4 MG/2ML IJ SOLN
INTRAMUSCULAR | Status: DC | PRN
  Administered 2016-03-02: 4 mg via INTRAVENOUS

## 2016-03-02 MED ORDER — ONDANSETRON HCL 4 MG PO TABS: 4.0000 mg | ORAL_TABLET | Freq: Four times a day (QID) | ORAL | Status: DC | PRN

## 2016-03-02 MED ORDER — NEOMYCIN-POLYMYXIN B GU 40-200000 IR SOLN
Status: AC
  Filled 2016-03-02: qty 4

## 2016-03-02 MED ORDER — ACETAMINOPHEN 10 MG/ML IV SOLN
INTRAVENOUS | Status: DC | PRN
Start: 2016-03-02 — End: 2016-03-02
  Administered 2016-03-02: 1000 mg via INTRAVENOUS

## 2016-03-02 MED ORDER — MAGNESIUM CITRATE PO SOLN
1.0000 | Freq: Once | ORAL | Status: DC | PRN
Start: 2016-03-02 — End: 2016-03-05
  Filled 2016-03-02: qty 296

## 2016-03-02 MED ORDER — MORPHINE SULFATE (PF) 2 MG/ML IV SOLN: 2.0000 mg | INTRAVENOUS | Status: DC | PRN

## 2016-03-02 SURGICAL SUPPLY — 36 items
BIT DRILL CANN LG 4.3MM (BIT) ×1 IMPLANT
CANISTER SUCT 1200ML W/VALVE (MISCELLANEOUS) ×3 IMPLANT
DRAPE INCISE IOBAN 66X45 STRL (DRAPES) ×3 IMPLANT
DRAPE SHEET LG 3/4 BI-LAMINATE (DRAPES) ×3 IMPLANT
DRAPE SURG 17X11 SM STRL (DRAPES) ×9 IMPLANT
DRAPE U-SHAPE 47X51 STRL (DRAPES) ×3 IMPLANT
DRILL BIT CANN LG 4.3MM (BIT) ×3
DRSG OPSITE POSTOP 4X12 (GAUZE/BANDAGES/DRESSINGS) ×3 IMPLANT
DURAPREP 26ML APPLICATOR (WOUND CARE) ×6 IMPLANT
ELECT CAUTERY BLADE 6.4 (BLADE) ×3 IMPLANT
ELECT REM PT RETURN 9FT ADLT (ELECTROSURGICAL) ×3
ELECTRODE REM PT RTRN 9FT ADLT (ELECTROSURGICAL) ×1 IMPLANT
GAUZE SPONGE 4X4 12PLY STRL (GAUZE/BANDAGES/DRESSINGS) IMPLANT
GLOVE BIOGEL PI IND STRL 9 (GLOVE) ×1 IMPLANT
GLOVE BIOGEL PI INDICATOR 9 (GLOVE) ×2
GLOVE SURG 9.0 ORTHO LTXF (GLOVE) ×9 IMPLANT
GLOVE SURG LX XRAY STRL SZ9 (GLOVE) IMPLANT
GOWN STRL REUS TWL 2XL XL LVL4 (GOWN DISPOSABLE) ×3 IMPLANT
GOWN STRL REUS W/ TWL LRG LVL3 (GOWN DISPOSABLE) ×1 IMPLANT
GOWN STRL REUS W/TWL LRG LVL3 (GOWN DISPOSABLE) ×2
GUIDEPIN VERSANAIL DSP 3.2X444 ×3 IMPLANT
HFN 125 DEG 11MM X 180MM (Orthopedic Implant) ×3 IMPLANT
HIP FRA NAIL LAG SCREW 10.5X90 (Orthopedic Implant) ×3 IMPLANT
MAT BLUE FLOOR 46X72 FLO (MISCELLANEOUS) ×3 IMPLANT
NEEDLE FILTER BLUNT 18X 1/2SAF (NEEDLE) ×2
NEEDLE FILTER BLUNT 18X1 1/2 (NEEDLE) ×1 IMPLANT
NS IRRIG 1000ML POUR BTL (IV SOLUTION) ×3 IMPLANT
PACK HIP COMPR (MISCELLANEOUS) ×3 IMPLANT
SCREW BONE CORTICAL 5.0X3 (Screw) ×3 IMPLANT
SCREW LAG HIP FRA NAIL 10.5X90 (Orthopedic Implant) ×1 IMPLANT
STAPLER SKIN PROX 35W (STAPLE) ×3 IMPLANT
STRAP SAFETY BODY (MISCELLANEOUS) ×3 IMPLANT
SUT VIC AB 1 CT1 36 (SUTURE) ×3 IMPLANT
SUT VIC AB 2-0 CT1 (SUTURE) ×3 IMPLANT
SYRINGE 10CC LL (SYRINGE) ×3 IMPLANT
TAPE MICROFOAM 4IN (TAPE) IMPLANT

## 2016-03-02 NOTE — Anesthesia Postprocedure Evaluation (Signed)
Anesthesia Post Note  Patient: Jasmine Stokes  Procedure(s) Performed: Procedure(s) (LRB): INTRAMEDULLARY (IM) NAIL INTERTROCHANTRIC (Right)  Patient location during evaluation: PACU Anesthesia Type: Spinal Level of consciousness: awake Pain management: pain level controlled Vital Signs Assessment: post-procedure vital signs reviewed and stable Respiratory status: spontaneous breathing Cardiovascular status: stable Anesthetic complications: no    Last Vitals:  Filed Vitals:   03/02/16 0753 03/02/16 1153  BP: 136/58 105/46  Pulse: 94   Temp: 36.8 C 36.1 C  Resp: 16 11    Last Pain:  Filed Vitals:   03/02/16 1159  PainSc: 0-No pain                 VAN STAVEREN,[name]

## 2016-03-02 NOTE — Progress Notes (Signed)
Subjective:  Patient doing well post-op.  Patient reports pain as mild.  She has no complaints.  Objective:   VITALS:   Filed Vitals:   03/02/16 1328 03/02/16 1436 03/02/16 1528 03/02/16 1620  BP: 113/49 114/60 107/53 130/61  Pulse: 80 92 94 82  Temp: 97.8 F (36.6 C) 97.5 F (36.4 C) 98.2 F (36.8 C) 98 F (36.7 C)  TempSrc: Oral Oral Oral Oral  Resp: 18 16 16 16   Height:      Weight:      SpO2: 94% 93% 95% 100%    PHYSICAL EXAM:  Right hip/lower extremity:  Dressing C/D/I.  Thigh and leg compartments are soft and compressible.  She has intact sensation to light touch.  She has intact motor function and palpable pedal pulses.    LABS  Results for orders placed or performed during the hospital encounter of 03/01/16 (from the past 24 hour(s))  Protime-INR     Status: None   Collection Time: 03/02/16  4:09 AM  Result Value Ref Range   Prothrombin Time 13.3 11.4 - 15.0 seconds   INR 0.99   Comprehensive metabolic panel     Status: Abnormal   Collection Time: 03/02/16  4:09 AM  Result Value Ref Range   Sodium 139 135 - 145 mmol/L   Potassium 4.3 3.5 - 5.1 mmol/L   Chloride 106 101 - 111 mmol/L   CO2 28 22 - 32 mmol/L   Glucose, Bld 108 (H) 65 - 99 mg/dL   BUN 22 (H) 6 - 20 mg/dL   Creatinine, Ser 0.82 0.44 - 1.00 mg/dL   Calcium 8.4 (L) 8.9 - 10.3 mg/dL   Total Protein 5.0 (L) 6.5 - 8.1 g/dL   Albumin 3.0 (L) 3.5 - 5.0 g/dL   AST 31 15 - 41 U/L   ALT 14 14 - 54 U/L   Alkaline Phosphatase 62 38 - 126 U/L   Total Bilirubin 1.9 (H) 0.3 - 1.2 mg/dL   GFR calc non Af Amer 59 (L) >60 mL/min   GFR calc Af Amer >60 >60 mL/min   Anion gap 5 5 - 15  CBC     Status: Abnormal   Collection Time: 03/02/16  5:28 AM  Result Value Ref Range   WBC 5.9 3.6 - 11.0 K/uL   RBC 3.26 (L) 3.80 - 5.20 MIL/uL   Hemoglobin 9.8 (L) 12.0 - 16.0 g/dL   HCT 29.5 (L) 35.0 - 47.0 %   MCV 90.5 80.0 - 100.0 fL   MCH 30.2 26.0 - 34.0 pg   MCHC 33.3 32.0 - 36.0 g/dL   RDW 13.9 11.5 - 14.5 %    Platelets 137 (L) 150 - 440 K/uL    Dg Chest 1 View  03/01/2016  CLINICAL DATA:  Pain following fall EXAM: CHEST 1 VIEW COMPARISON:  None. FINDINGS: No edema or consolidation. Heart size and pulmonary vascularity are normal. No adenopathy. There is atherosclerotic calcification in the aorta. No pneumothorax. No fractures evident. Bones osteoporotic. There is calcification in each carotid artery. IMPRESSION: No edema or consolidation. No pneumothorax. Areas of carotid artery and thoracic aortic calcification. Bones osteoporotic. Electronically Signed   By: Lowella Grip III M.D.   On: 03/01/2016 10:37   Ct Head Wo Contrast  03/01/2016  CLINICAL DATA:  Status post fall this morning with a blow to the head. The patient's walker slipped out from under her. Initial encounter. EXAM: CT HEAD WITHOUT CONTRAST CT MAXILLOFACIAL WITHOUT CONTRAST CT CERVICAL SPINE WITHOUT  CONTRAST TECHNIQUE: Multidetector CT imaging of the head, cervical spine, and maxillofacial structures were performed using the standard protocol without intravenous contrast. Multiplanar CT image reconstructions of the cervical spine and maxillofacial structures were also generated. COMPARISON:  Head CT scan 02/05/2013. FINDINGS: CT HEAD FINDINGS Cortical atrophy and chronic microvascular ischemic change are seen. No evidence of acute intracranial abnormality including hemorrhage, infarct, midline shift or abnormal extra-axial fluid collection. Calcified extra-axial lesion over the high right frontal convexities measuring 0.4 cm in diameter is unchanged and may be a tiny meningioma. No hydrocephalus or pneumocephalus. The calvarium is intact. CT MAXILLOFACIAL FINDINGS The patient has a minimally depressed right nasal bone fracture. There is also minimally depressed fracture of the anterior wall of the right maxillary sinus. No other fracture is identified. There is a tiny amount of hemorrhage in the right maxillary sinus. Mucosal thickening  right sphenoid sinus is noted. Mandibular condyles are located. The patient is status post bilateral lens extraction. The globes are intact and orbital fat is clear. Soft tissue contusion over the right maxilla is identified. CT CERVICAL SPINE FINDINGS No fracture or malalignment cervical spine is identified. Mild loss of disc space height is seen at C4-5 and C6-7. Lung apices are clear. IMPRESSION: Minimally depressed fractures of the right nasal bone in the anterior wall of the right maxillary sinus. Soft tissue contusion over the right maxilla and tiny amount of hemorrhage in the right maxillary sinus are identified. No acute abnormality head or cervical spine. Atrophy and chronic microvascular ischemic change. Electronically Signed   By: Inge Rise M.D.   On: 03/01/2016 11:06   Ct Cervical Spine Wo Contrast  03/01/2016  CLINICAL DATA:  Status post fall this morning with a blow to the head. The patient's walker slipped out from under her. Initial encounter. EXAM: CT HEAD WITHOUT CONTRAST CT MAXILLOFACIAL WITHOUT CONTRAST CT CERVICAL SPINE WITHOUT CONTRAST TECHNIQUE: Multidetector CT imaging of the head, cervical spine, and maxillofacial structures were performed using the standard protocol without intravenous contrast. Multiplanar CT image reconstructions of the cervical spine and maxillofacial structures were also generated. COMPARISON:  Head CT scan 02/05/2013. FINDINGS: CT HEAD FINDINGS Cortical atrophy and chronic microvascular ischemic change are seen. No evidence of acute intracranial abnormality including hemorrhage, infarct, midline shift or abnormal extra-axial fluid collection. Calcified extra-axial lesion over the high right frontal convexities measuring 0.4 cm in diameter is unchanged and may be a tiny meningioma. No hydrocephalus or pneumocephalus. The calvarium is intact. CT MAXILLOFACIAL FINDINGS The patient has a minimally depressed right nasal bone fracture. There is also minimally  depressed fracture of the anterior wall of the right maxillary sinus. No other fracture is identified. There is a tiny amount of hemorrhage in the right maxillary sinus. Mucosal thickening right sphenoid sinus is noted. Mandibular condyles are located. The patient is status post bilateral lens extraction. The globes are intact and orbital fat is clear. Soft tissue contusion over the right maxilla is identified. CT CERVICAL SPINE FINDINGS No fracture or malalignment cervical spine is identified. Mild loss of disc space height is seen at C4-5 and C6-7. Lung apices are clear. IMPRESSION: Minimally depressed fractures of the right nasal bone in the anterior wall of the right maxillary sinus. Soft tissue contusion over the right maxilla and tiny amount of hemorrhage in the right maxillary sinus are identified. No acute abnormality head or cervical spine. Atrophy and chronic microvascular ischemic change. Electronically Signed   By: Inge Rise M.D.   On: 03/01/2016 11:06  Dg Hip Port Unilat With Pelvis 1v Right  03/02/2016  CLINICAL DATA:  Status post intra medullary nail placement. EXAM: DG HIP (WITH OR WITHOUT PELVIS) 1V PORT RIGHT COMPARISON:  Mar 01, 2016 FINDINGS: An intra medullary may nail has been placed across the right intertrochanteric fracture. Hardware is in good position. Alignment has significantly improved. No dislocation identified although the lateral image is limited. There appears to be an old fracture of the left greater trochanter, stable. No other acute abnormalities. IMPRESSION: Intra medullary nail placement across the right intertrochanteric fracture. Hardware is in good position. Electronically Signed   By: Dorise Bullion III M.D   On: 03/02/2016 13:10   Dg Hip Operative Unilat W Or W/o Pelvis Right  03/02/2016  CLINICAL DATA:  82 year old female with history of right-sided hip fracture. Status post ORIF. EXAM: OPERATIVE RIGHT HIP (WITH PELVIS IF PERFORMED) 4 VIEWS TECHNIQUE:  Fluoroscopic spot image(s) were submitted for interpretation post-operatively. COMPARISON:  03/01/2016. FINDINGS: Four intraoperative spot views of the right hip document interval placement of a gamma nail fixation device traversing the previously noted comminuted intertrochanteric hip fracture, with restoration of normal anatomic alignment. Right hip is properly located. No acute complicating features. IMPRESSION: 1. Intraoperative documentation of ORIF for right hip fracture with restoration of anatomic alignment. Electronically Signed   By: Vinnie Langton M.D.   On: 03/02/2016 14:42   Dg Hip Unilat  With Pelvis 2-3 Views Right  03/01/2016  CLINICAL DATA:  Pain following fall EXAM: DG HIP (WITH OR WITHOUT PELVIS) 2-3V RIGHT COMPARISON:  None. FINDINGS: Frontal pelvis as well as frontal and lateral right hip images were obtained. There is a comminuted intertrochanteric femur fracture on the right with varus angulation fracture site. There is avulsion of the lesser trochanter. There is impaction at the fracture site with multiple displaced fracture fragments throughout the intertrochanteric region. No other fractures. No dislocation. There is mild symmetric narrowing of both hip joints. Bones are diffusely osteoporotic. IMPRESSION: Comminuted intertrochanteric femur fracture on the right with displaced fracture fragments as well as impaction and varus angulation at the fracture site. No other fractures. No dislocations. Narrowing both hip joints, symmetric. Bones diffusely osteoporotic. Electronically Signed   By: Lowella Grip III M.D.   On: 03/01/2016 10:36   Ct Maxillofacial Wo Cm  03/01/2016  CLINICAL DATA:  Status post fall this morning with a blow to the head. The patient's walker slipped out from under her. Initial encounter. EXAM: CT HEAD WITHOUT CONTRAST CT MAXILLOFACIAL WITHOUT CONTRAST CT CERVICAL SPINE WITHOUT CONTRAST TECHNIQUE: Multidetector CT imaging of the head, cervical spine, and  maxillofacial structures were performed using the standard protocol without intravenous contrast. Multiplanar CT image reconstructions of the cervical spine and maxillofacial structures were also generated. COMPARISON:  Head CT scan 02/05/2013. FINDINGS: CT HEAD FINDINGS Cortical atrophy and chronic microvascular ischemic change are seen. No evidence of acute intracranial abnormality including hemorrhage, infarct, midline shift or abnormal extra-axial fluid collection. Calcified extra-axial lesion over the high right frontal convexities measuring 0.4 cm in diameter is unchanged and may be a tiny meningioma. No hydrocephalus or pneumocephalus. The calvarium is intact. CT MAXILLOFACIAL FINDINGS The patient has a minimally depressed right nasal bone fracture. There is also minimally depressed fracture of the anterior wall of the right maxillary sinus. No other fracture is identified. There is a tiny amount of hemorrhage in the right maxillary sinus. Mucosal thickening right sphenoid sinus is noted. Mandibular condyles are located. The patient is status post bilateral lens extraction.  The globes are intact and orbital fat is clear. Soft tissue contusion over the right maxilla is identified. CT CERVICAL SPINE FINDINGS No fracture or malalignment cervical spine is identified. Mild loss of disc space height is seen at C4-5 and C6-7. Lung apices are clear. IMPRESSION: Minimally depressed fractures of the right nasal bone in the anterior wall of the right maxillary sinus. Soft tissue contusion over the right maxilla and tiny amount of hemorrhage in the right maxillary sinus are identified. No acute abnormality head or cervical spine. Atrophy and chronic microvascular ischemic change. Electronically Signed   By: Inge Rise M.D.   On: 03/01/2016 11:06    Assessment/Plan: Day of Surgery   Active Problems:   Intertrochanteric fracture of right femur [facility])  Patient is doing well postop. I have reviewed her  postoperative x-rays which demonstrate the fractures well reduced and the hardware is well positioned. She will complete 24 hours of postop antibiotics. Patient began physical occupational therapy tomorrow. Labs will be checked in the morning.    Thornton Park , MD 03/02/2016, 4:58 PM

## 2016-03-02 NOTE — Transfer of Care (Signed)
Immediate Anesthesia Transfer of Care Note  Patient: Jasmine Stokes  Procedure(s) Performed: Procedure(s): INTRAMEDULLARY (IM) NAIL INTERTROCHANTRIC (Right)  Patient Location: PACU  Anesthesia Type:Spinal  Level of Consciousness: awake, alert  and oriented  Airway & Oxygen Therapy: Patient Spontanous Breathing and Patient connected to face mask oxygen  Post-op Assessment: Report given to RN and Post -op Vital signs reviewed and stable  Post vital signs: Reviewed and stable  Last Vitals:  Filed Vitals:   03/02/16 0753 03/02/16 1153  BP: 136/58 105/46  Pulse: 94   Temp: 36.8 C 36.1 C  Resp: 16 11    Last Pain:  Filed Vitals:   03/02/16 1154  PainSc: Asleep         Complications: No apparent anesthesia complications

## 2016-03-02 NOTE — Anesthesia Preprocedure Evaluation (Signed)
Anesthesia Evaluation  Patient identified by MRN, date of birth, ID band Patient awake    Reviewed: Allergy & Precautions, NPO status , Patient's Chart, lab work & pertinent test results  Airway Mallampati: II       Dental  (+) Edentulous Upper, Edentulous Lower   Pulmonary neg pulmonary ROS,    breath sounds clear to auscultation       Cardiovascular Exercise Tolerance: Poor hypertension, Pt. on medications  Rhythm:Regular Rate:Normal     Neuro/Psych negative neurological ROS  negative psych ROS   GI/Hepatic negative GI ROS, Neg liver ROS,   Endo/Other  negative endocrine ROS  Renal/GU negative Renal ROS     Musculoskeletal negative musculoskeletal ROS (+)   Abdominal Normal abdominal exam  (+)   Peds  Hematology  (+) anemia ,   Anesthesia Other Findings   Reproductive/Obstetrics                             Anesthesia Physical Anesthesia Plan  ASA: III  Anesthesia Plan: Spinal   Post-op Pain Management:    Induction:   Airway Management Planned: Natural Airway and Nasal Cannula  Additional Equipment:   Intra-op Plan:   Post-operative Plan:   Informed Consent: I have reviewed the patients History and Physical, chart, labs and discussed the procedure including the risks, benefits and alternatives for the proposed anesthesia with the patient or authorized representative who has indicated his/her understanding and acceptance.     Plan Discussed with: CRNA  Anesthesia Plan Comments:         Anesthesia Quick Evaluation

## 2016-03-02 NOTE — Op Note (Signed)
DATE OF SURGERY:  03/02/2016  TIME: 11:54 AM  PATIENT NAME:  Jasmine Stokes  AGE: 82 y.o.  PRE-OPERATIVE DIAGNOSIS:  right hip fracture  POST-OPERATIVE DIAGNOSIS:  SAME  PROCEDURE:  INTRAMEDULLARY (IM) NAIL INTERTROCHANTRIC  SURGEON:  Zackari Ruane  OPERATIVE IMPLANTS: Biomet short Affixus nail 11x380, 90 mm lag screw with a 34 mm distal interlocking screw  PREOPERATIVE INDICATIONS:  Jasmine Stokes is a 82 y.o. year old who fell and suffered a hip fracture. She was brought into the ER and then admitted and medically cleared for surgical intervention.    The risks, benefits and alternatives were discussed with the patient and their family.  The risks include but are not limited to infection, bleeding, nerve or blood vessel injury, malunion, nonunion, hardware prominence, hardware failure, change in leg lengths or lower extremity rotation need for further surgery including hardware removal with conversion to a total hip arthroplasty. Medical risks include but are not limited to DVT and pulmonary embolism, myocardial infarction, stroke, pneumonia, respiratory failure and death. The patient and their family understood these risks and wished to proceed with surgery.  OPERATIVE PROCEDURE:  The patient was brought to the operating room and placed in the supine position on the fracture table. Spinal anesthesia was administered.  A closed reduction was performed under C-arm guidance.  The fracture reduction was confirmed on both AP and lateral views. A time out was performed to verify the patient's name, date of birth, medical record number, correct site of surgery correct procedure to be performed. The timeout was also used to verify the patient received antibiotics and all appropriate instruments, implants and radiographic studies were available in the room. Once all in attendance were in agreement, the case began. The patient was prepped and draped in a sterile fashion. She received  preoperative antibiotics.  An incision was made proximal to the greater trochanter in line with the femur. A guidewire was placed over the tip of the greater trochanter and advanced into the proximal femur to the level of the lesser trochanter.  Confirmation of the drill pin position was made on AP and lateral C-arm images.  The threaded guidepin was then overdrilled with the proximal femoral drill.  The nail was then inserted into the proximal femur, across the fracture site and into the femoral shaft. Its position was confirmed on AP and lateral C-arm images.   Once the nail was completely seated, the drill guide for the lag screw was placed through the guide arm for the Affixus nail. A guidepin was then placed through this drill guide and advanced through the lateral cortex of the femur, across the fracture site and into the femoral head achieving a tip apex distance of less than 25 mm. The length of the drill pin was measured, and then the drill for the lag screw was advanced through the lateral cortex, across the fracture site and up into the femoral head to the depth of the lag screw..  The lag screw was then advanced by hand into position across the fracture site into the femoral head. Its final position was confirmed on AP and lateral C-arm images. Compression was applied as traction was carefully released. The set screw in the top of the intramedullary rod was tightened by hand using a screwdriver. It was backed off a quarter turn to allow for compression at the fracture site.  The drill sleeve for the distal interlocking screw was then placed through the Affixus guide arm. A small stab incision  was made to allow the drill guide to approximate the lateral cortex of the femur. The drill for the distal interlocking screw was then advanced bicortically. The depth of this drill was measured. A distal interlocking screw with the length measured was then inserted by hand through the guide arm. Final C-arm  images of the entire intramedullary construct were taken in both the AP and lateral planes.   The wounds were irrigated copiously and closed with 0 Vicryl for closure of the deep fascia and 2-0 Vicryl for subcutaneous closure. The skin was approximated with staples. A dry sterile dressing was applied. I was scrubbed and present the entire case and all sharp and instrument counts were correct at the conclusion of the case. Patient was transferred to hospital bed and brought to PACU in stable condition. I spoke with the patient's family in the postop consultation room to let them know the case had gone without complication and the patient was stable in the recovery room.    Timoteo Gaul, MD

## 2016-03-02 NOTE — Consult Note (Signed)
ANTICOAGULATION CONSULT NOTE - Initial Consult  Pharmacy Consult for enoxaparin Indication: VTE prophylaxis  No Known Allergies  Patient Measurements: Height: 5' (152.4 cm) Weight: 88 lb (39.917 kg) IBW/kg (Calculated) : 45.5 Heparin Dosing Weight:   Vital Signs: Temp: 97.8 F (36.6 C) (05/13 1328) Temp Source: Oral (05/13 1328) BP: 113/49 mmHg (05/13 1328) Pulse Rate: 80 (05/13 1328)  Labs:  Recent Labs  03/01/16 0957 03/01/16 0959 03/02/16 0409 03/02/16 0528  HGB 12.6  --   --  9.8*  HCT 37.5  --   --  29.5*  PLT 160  --   --  137*  APTT  --  27  --   --   LABPROT  --  12.3 13.3  --   INR  --  0.89 0.99  --   CREATININE 1.01*  --  0.82  --     Estimated Creatinine Clearance: 25.8 mL/min (by C-G formula based on Cr of 0.82).   Medical History: Past Medical History  Diagnosis Date  . Hypertension   . Cancer (HCC)     Medications:  Scheduled:  . amLODipine  2.5 mg Oral Daily  .  ceFAZolin (ANCEF) IV  2 g Intravenous Q6H  . docusate sodium  100 mg Oral BID  . [START ON 03/03/2016] enoxaparin (LOVENOX) injection  30 mg Subcutaneous Q24H  . ferrous sulfate  325 mg Oral TID PC  . senna  1 tablet Oral BID    Assessment: Pt is s/p INTRAMEDULLARY (IM) NAIL INTERTROCHANTRIC Goal of Therapy:   Monitor platelets by anticoagulation protocol: Yes   Plan:  Lovenox 30mg  daily. Will start tomorrow at 0800 .  Ramond Dial, Pharm.D Clinical Pharmacist   03/02/2016,1:58 PM

## 2016-03-02 NOTE — Progress Notes (Signed)
Pt has a chronic "leaky stool" per patients family states she has been like that for years. Pt noted to have small soft BM when she got back from surgery.

## 2016-03-02 NOTE — Anesthesia Procedure Notes (Addendum)
Spinal  Start time: 03/02/2016 10:32 AM End time: 03/02/2016 10:37 AM Reason for block: at surgeon's request Staffing Anesthesiologist: Marline Backbone F Performed by: anesthesiologist  Preanesthetic Checklist Completed: patient identified, site marked, surgical consent, pre-op evaluation, timeout performed, IV checked, risks and benefits discussed, monitors and equipment checked and at surgeon's request Spinal Block Patient position: right lateral decubitus Prep: Betadine Patient monitoring: heart rate and blood pressure Approach: midline Location: L3-4 Injection technique: single-shot Needle Needle type: Quincke  Needle gauge: 25 G Needle length: 9 cm Needle insertion depth: 5 cm Assessment Sensory level: T8  Date/Time: 03/02/2016 10:28 AM Performed by: Johnna Acosta Pre-anesthesia Checklist: Patient identified, Emergency Drugs available, Suction available, Patient being monitored and Timeout performed Patient Re-evaluated:Patient Re-evaluated prior to inductionOxygen Delivery Method: Simple face mask

## 2016-03-02 NOTE — Progress Notes (Signed)
[name] [facility] [place] NAME: Jasmine Stokes    MR#:  [id number]  DATE OF BIRTH:  1919/05/26  SUBJECTIVE:  CHIEF COMPLAINT:   Chief Complaint  Patient presents with  . Fall    Had right hip fracture, status post surgery today.   No complaints currently.   REVIEW OF SYSTEMS:  CONSTITUTIONAL: No fever, fatigue or weakness.  EYES: No blurred or double vision.  EARS, NOSE, AND THROAT: No tinnitus or ear pain.  RESPIRATORY: No cough, shortness of breath, wheezing or hemoptysis.  CARDIOVASCULAR: No chest pain, orthopnea, edema.  GASTROINTESTINAL: No nausea, vomiting, diarrhea or abdominal pain.  GENITOURINARY: No dysuria, hematuria.  ENDOCRINE: No polyuria, nocturia,  HEMATOLOGY: No anemia, easy bruising or bleeding SKIN: No rash or lesion. MUSCULOSKELETAL: No joint pain or arthritis.   NEUROLOGIC: No tingling, numbness, weakness.  PSYCHIATRY: No anxiety or depression.   ROS  DRUG ALLERGIES:  No Known Allergies  VITALS:  Blood pressure 130/61, pulse 82, temperature 98 F (36.7 C), temperature source Oral, resp. rate 16, height 5' (1.524 m), weight 39.917 kg (88 lb), SpO2 100 %.  PHYSICAL EXAMINATION:  GENERAL:  82 y.o.-year-old patient lying in the bed with no acute distress.  EYES: Pupils equal, round, reactive to light and accommodation. No scleral icterus. Extraocular muscles intact.  HEENT: Head atraumatic, normocephalic. Oropharynx and nasopharynx clear.  NECK:  Supple, no jugular venous distention. No thyroid enlargement, no tenderness.  LUNGS: Normal breath sounds bilaterally, no wheezing, rales,rhonchi or crepitation. No use of accessory muscles of respiration.  CARDIOVASCULAR: S1, S2 normal. No murmurs, rubs, or gallops.  ABDOMEN: Soft, nontender, nondistended. Bowel sounds present. No organomegaly or mass.  EXTREMITIES: No pedal edema, cyanosis, or clubbing.  NEUROLOGIC: Cranial nerves II through XII are intact. Muscle  strength 5/5 in all extremities. Sensation intact. Gait not checked.  PSYCHIATRIC: The patient is alert and oriented x 3.  SKIN: No obvious rash, lesion, or ulcer.   Physical Exam LABORATORY PANEL:   CBC  Recent Labs Lab 03/02/16 0528  WBC 5.9  HGB 9.8*  HCT 29.5*  PLT 137*   ------------------------------------------------------------------------------------------------------------------  Chemistries   Recent Labs Lab 03/02/16 0409  NA 139  K 4.3  CL 106  CO2 28  GLUCOSE 108*  BUN 22*  CREATININE 0.82  CALCIUM 8.4*  AST 31  ALT 14  ALKPHOS 62  BILITOT 1.9*   ------------------------------------------------------------------------------------------------------------------  Cardiac Enzymes No results for input(s): TROPONINI in the last 168 hours. ------------------------------------------------------------------------------------------------------------------  RADIOLOGY:  Dg Chest 1 View  03/01/2016  CLINICAL DATA:  Pain following fall EXAM: CHEST 1 VIEW COMPARISON:  None. FINDINGS: No edema or consolidation. Heart size and pulmonary vascularity are normal. No adenopathy. There is atherosclerotic calcification in the aorta. No pneumothorax. No fractures evident. Bones osteoporotic. There is calcification in each carotid artery. IMPRESSION: No edema or consolidation. No pneumothorax. Areas of carotid artery and thoracic aortic calcification. Bones osteoporotic. Electronically Signed   By: Lowella Grip III M.D.   On: 03/01/2016 10:37   Ct Head Wo Contrast  03/01/2016  CLINICAL DATA:  Status post fall this morning with a blow to the head. The patient's walker slipped out from under her. Initial encounter. EXAM: CT HEAD WITHOUT CONTRAST CT MAXILLOFACIAL WITHOUT CONTRAST CT CERVICAL SPINE WITHOUT CONTRAST TECHNIQUE: Multidetector CT imaging of the head, cervical spine, and maxillofacial structures were performed using the standard protocol without intravenous contrast.  Multiplanar CT image reconstructions of the cervical spine and maxillofacial structures  were also generated. COMPARISON:  Head CT scan 02/05/2013. FINDINGS: CT HEAD FINDINGS Cortical atrophy and chronic microvascular ischemic change are seen. No evidence of acute intracranial abnormality including hemorrhage, infarct, midline shift or abnormal extra-axial fluid collection. Calcified extra-axial lesion over the high right frontal convexities measuring 0.4 cm in diameter is unchanged and may be a tiny meningioma. No hydrocephalus or pneumocephalus. The calvarium is intact. CT MAXILLOFACIAL FINDINGS The patient has a minimally depressed right nasal bone fracture. There is also minimally depressed fracture of the anterior wall of the right maxillary sinus. No other fracture is identified. There is a tiny amount of hemorrhage in the right maxillary sinus. Mucosal thickening right sphenoid sinus is noted. Mandibular condyles are located. The patient is status post bilateral lens extraction. The globes are intact and orbital fat is clear. Soft tissue contusion over the right maxilla is identified. CT CERVICAL SPINE FINDINGS No fracture or malalignment cervical spine is identified. Mild loss of disc space height is seen at C4-5 and C6-7. Lung apices are clear. IMPRESSION: Minimally depressed fractures of the right nasal bone in the anterior wall of the right maxillary sinus. Soft tissue contusion over the right maxilla and tiny amount of hemorrhage in the right maxillary sinus are identified. No acute abnormality head or cervical spine. Atrophy and chronic microvascular ischemic change. Electronically Signed   By: Inge Rise M.D.   On: 03/01/2016 11:06   Ct Cervical Spine Wo Contrast  03/01/2016  CLINICAL DATA:  Status post fall this morning with a blow to the head. The patient's walker slipped out from under her. Initial encounter. EXAM: CT HEAD WITHOUT CONTRAST CT MAXILLOFACIAL WITHOUT CONTRAST CT CERVICAL SPINE  WITHOUT CONTRAST TECHNIQUE: Multidetector CT imaging of the head, cervical spine, and maxillofacial structures were performed using the standard protocol without intravenous contrast. Multiplanar CT image reconstructions of the cervical spine and maxillofacial structures were also generated. COMPARISON:  Head CT scan 02/05/2013. FINDINGS: CT HEAD FINDINGS Cortical atrophy and chronic microvascular ischemic change are seen. No evidence of acute intracranial abnormality including hemorrhage, infarct, midline shift or abnormal extra-axial fluid collection. Calcified extra-axial lesion over the high right frontal convexities measuring 0.4 cm in diameter is unchanged and may be a tiny meningioma. No hydrocephalus or pneumocephalus. The calvarium is intact. CT MAXILLOFACIAL FINDINGS The patient has a minimally depressed right nasal bone fracture. There is also minimally depressed fracture of the anterior wall of the right maxillary sinus. No other fracture is identified. There is a tiny amount of hemorrhage in the right maxillary sinus. Mucosal thickening right sphenoid sinus is noted. Mandibular condyles are located. The patient is status post bilateral lens extraction. The globes are intact and orbital fat is clear. Soft tissue contusion over the right maxilla is identified. CT CERVICAL SPINE FINDINGS No fracture or malalignment cervical spine is identified. Mild loss of disc space height is seen at C4-5 and C6-7. Lung apices are clear. IMPRESSION: Minimally depressed fractures of the right nasal bone in the anterior wall of the right maxillary sinus. Soft tissue contusion over the right maxilla and tiny amount of hemorrhage in the right maxillary sinus are identified. No acute abnormality head or cervical spine. Atrophy and chronic microvascular ischemic change. Electronically Signed   By: Inge Rise M.D.   On: 03/01/2016 11:06   Dg Hip Port Unilat With Pelvis 1v Right  03/02/2016  CLINICAL DATA:  Status post  intra medullary nail placement. EXAM: DG HIP (WITH OR WITHOUT PELVIS) 1V PORT RIGHT COMPARISON:  Mar 01, 2016 FINDINGS: An intra medullary may nail has been placed across the right intertrochanteric fracture. Hardware is in good position. Alignment has significantly improved. No dislocation identified although the lateral image is limited. There appears to be an old fracture of the left greater trochanter, stable. No other acute abnormalities. IMPRESSION: Intra medullary nail placement across the right intertrochanteric fracture. Hardware is in good position. Electronically Signed   By: Dorise Bullion III M.D   On: 03/02/2016 13:10   Dg Hip Operative Unilat W Or W/o Pelvis Right  03/02/2016  CLINICAL DATA:  82 year old female with history of right-sided hip fracture. Status post ORIF. EXAM: OPERATIVE RIGHT HIP (WITH PELVIS IF PERFORMED) 4 VIEWS TECHNIQUE: Fluoroscopic spot image(s) were submitted for interpretation post-operatively. COMPARISON:  03/01/2016. FINDINGS: Four intraoperative spot views of the right hip document interval placement of a gamma nail fixation device traversing the previously noted comminuted intertrochanteric hip fracture, with restoration of normal anatomic alignment. Right hip is properly located. No acute complicating features. IMPRESSION: 1. Intraoperative documentation of ORIF for right hip fracture with restoration of anatomic alignment. Electronically Signed   By: Vinnie Langton M.D.   On: 03/02/2016 14:42   Dg Hip Unilat  With Pelvis 2-3 Views Right  03/01/2016  CLINICAL DATA:  Pain following fall EXAM: DG HIP (WITH OR WITHOUT PELVIS) 2-3V RIGHT COMPARISON:  None. FINDINGS: Frontal pelvis as well as frontal and lateral right hip images were obtained. There is a comminuted intertrochanteric femur fracture on the right with varus angulation fracture site. There is avulsion of the lesser trochanter. There is impaction at the fracture site with multiple displaced fracture  fragments throughout the intertrochanteric region. No other fractures. No dislocation. There is mild symmetric narrowing of both hip joints. Bones are diffusely osteoporotic. IMPRESSION: Comminuted intertrochanteric femur fracture on the right with displaced fracture fragments as well as impaction and varus angulation at the fracture site. No other fractures. No dislocations. Narrowing both hip joints, symmetric. Bones diffusely osteoporotic. Electronically Signed   By: Lowella Grip III M.D.   On: 03/01/2016 10:36   Ct Maxillofacial Wo Cm  03/01/2016  CLINICAL DATA:  Status post fall this morning with a blow to the head. The patient's walker slipped out from under her. Initial encounter. EXAM: CT HEAD WITHOUT CONTRAST CT MAXILLOFACIAL WITHOUT CONTRAST CT CERVICAL SPINE WITHOUT CONTRAST TECHNIQUE: Multidetector CT imaging of the head, cervical spine, and maxillofacial structures were performed using the standard protocol without intravenous contrast. Multiplanar CT image reconstructions of the cervical spine and maxillofacial structures were also generated. COMPARISON:  Head CT scan 02/05/2013. FINDINGS: CT HEAD FINDINGS Cortical atrophy and chronic microvascular ischemic change are seen. No evidence of acute intracranial abnormality including hemorrhage, infarct, midline shift or abnormal extra-axial fluid collection. Calcified extra-axial lesion over the high right frontal convexities measuring 0.4 cm in diameter is unchanged and may be a tiny meningioma. No hydrocephalus or pneumocephalus. The calvarium is intact. CT MAXILLOFACIAL FINDINGS The patient has a minimally depressed right nasal bone fracture. There is also minimally depressed fracture of the anterior wall of the right maxillary sinus. No other fracture is identified. There is a tiny amount of hemorrhage in the right maxillary sinus. Mucosal thickening right sphenoid sinus is noted. Mandibular condyles are located. The patient is status post  bilateral lens extraction. The globes are intact and orbital fat is clear. Soft tissue contusion over the right maxilla is identified. CT CERVICAL SPINE FINDINGS No fracture or malalignment cervical spine is identified. Mild loss  of disc space height is seen at C4-5 and C6-7. Lung apices are clear. IMPRESSION: Minimally depressed fractures of the right nasal bone in the anterior wall of the right maxillary sinus. Soft tissue contusion over the right maxilla and tiny amount of hemorrhage in the right maxillary sinus are identified. No acute abnormality head or cervical spine. Atrophy and chronic microvascular ischemic change. Electronically Signed   By: Inge Rise M.D.   On: 03/01/2016 11:06    ASSESSMENT AND PLAN:   Active Problems:   Intertrochanteric fracture of right femur (HCC)   #Right hip intertrochanteric fracture S/p surgery 03/02/16. Pain management as needed PT evaluation after surgery  #Essential hypertension Resume home medication amlodipine and titrate as needed  #History of anxiety resume home medications Xanax  #Nasal bone fracture from the fall Pain management as needed  #History of breast cancer status post lumpectomy currently under remission no interventions needed at this time   All the records are reviewed and case discussed with Care Management/Social Workerr. Management plans discussed with the patient, family and they are in agreement.  TOTAL TIME TAKING CARE OF THIS PATIENT: 35 minutes.     POSSIBLE D/C IN 2 DAYS, DEPENDING ON CLINICAL CONDITION.   Vaughan Basta M.D on 03/02/2016   Between 7am to 6pm - Pager - [phone]  After 6pm go to [email or web address] - password [email or web address] [facility]  Office  [phone]  CC: Primary care physician; No primary care provider on file.  Note: This dictation was prepared with Dragon dictation along with smaller phrase technology. Any transcriptional errors that result from this  process are unintentional.

## 2016-03-03 ENCOUNTER — Inpatient Hospital Stay: Payer: Medicare Other

## 2016-03-03 LAB — CBC
HCT: 26.4 % — ABNORMAL LOW (ref 35.0–47.0)
Hemoglobin: 8.8 g/dL — ABNORMAL LOW (ref 12.0–16.0)
MCH: 30 pg (ref 26.0–34.0)
MCHC: 33.4 g/dL (ref 32.0–36.0)
MCV: 89.7 fL (ref 80.0–100.0)
PLATELETS: 115 10*3/uL — AB (ref 150–440)
RBC: 2.94 MIL/uL — ABNORMAL LOW (ref 3.80–5.20)
RDW: 13.6 % (ref 11.5–14.5)
WBC: 8.9 10*3/uL (ref 3.6–11.0)

## 2016-03-03 LAB — BASIC METABOLIC PANEL
Anion gap: 6 (ref 5–15)
BUN: 22 mg/dL — ABNORMAL HIGH (ref 6–20)
CO2: 26 mmol/L (ref 22–32)
Calcium: 8 mg/dL — ABNORMAL LOW (ref 8.9–10.3)
Chloride: 106 mmol/L (ref 101–111)
Creatinine, Ser: 0.92 mg/dL (ref 0.44–1.00)
GFR calc Af Amer: 59 mL/min — ABNORMAL LOW (ref >60)
GFR calc non Af Amer: 51 mL/min — ABNORMAL LOW (ref >60)
GLUCOSE: 142 mg/dL — AB (ref 65–99)
POTASSIUM: 3.9 mmol/L (ref 3.5–5.1)
SODIUM: 138 mmol/L (ref 135–145)

## 2016-03-03 LAB — HEMOGLOBIN: HEMOGLOBIN: 9.1 g/dL — AB (ref 12.0–16.0)

## 2016-03-03 LAB — TSH: TSH: 1.075 u[IU]/mL (ref 0.350–4.500)

## 2016-03-03 MED ORDER — METOPROLOL TARTRATE 25 MG PO TABS
12.5000 mg | ORAL_TABLET | Freq: Two times a day (BID) | ORAL | Status: DC
  Administered 2016-03-03 – 2016-03-05 (×4): 12.5 mg via ORAL
  Filled 2016-03-03 (×5): qty 1

## 2016-03-03 MED ORDER — SODIUM CHLORIDE 0.9 % IV BOLUS (SEPSIS)
1000.0000 mL | Freq: Once | INTRAVENOUS | Status: AC
  Administered 2016-03-03: 1000 mL via INTRAVENOUS

## 2016-03-03 NOTE — Progress Notes (Signed)
PT Cancellation Note  Patient Details Name: Jasmine Stokes MRN: [id number] DOB: 05-30-1919   Cancelled Treatment:    Reason Eval/Treat Not Completed: Medical issues which prohibited therapy. While obtaining subjective history and setting up recliner, Dr. Ether Griffins entered room and performed assessment, finding patient to be tachycardic with swelling in L LE. Requested PT hold at this time until further testing to r/o DVT/PE is performed.   Dorice Lamas, PT, DPT 03/03/2016, 8:42 AM

## 2016-03-03 NOTE — Progress Notes (Signed)
Subjective:  Postop day 1 status post intramedullary fixation for right intertrochanteric hip fracture. Patient is sitting in bed eating her lunch. She has only mild right hip pain. She has no other complaints including shortness of breath, chest pain or abdominal pain.   Objective:   VITALS:   Filed Vitals:   03/02/16 2137 03/03/16 0435 03/03/16 0739 03/03/16 1229  BP: 145/76 140/69 146/74 123/52  Pulse: 117 117 100 103  Temp: 98.3 F (36.8 C) 97.7 F (36.5 C) 98.6 F (37 C)   TempSrc: Oral Oral Oral   Resp: 16 16 16    Height:      Weight:      SpO2: 94% 96% 95%     PHYSICAL EXAM:  Right lower extremity: Patient's thigh has ecchymosis but no erythema. She has scant serosanguineous spotting on her honeycomb dressing. Her thigh and leg compartments are soft and compressible. She has intact sensation throughout the right lower extremity. She can flex and extend her toes and dorsiflex and plantarflex her ankle. She has palpable pedal pulses. Patient has no calf tenderness or lower leg edema.  LABS  Results for orders placed or performed during the hospital encounter of 03/01/16 (from the past 24 hour(s))  CBC     Status: Abnormal   Collection Time: 03/03/16  3:27 AM  Result Value Ref Range   WBC 8.9 3.6 - 11.0 K/uL   RBC 2.94 (L) 3.80 - 5.20 MIL/uL   Hemoglobin 8.8 (L) 12.0 - 16.0 g/dL   HCT 26.4 (L) 35.0 - 47.0 %   MCV 89.7 80.0 - 100.0 fL   MCH 30.0 26.0 - 34.0 pg   MCHC 33.4 32.0 - 36.0 g/dL   RDW 13.6 11.5 - 14.5 %   Platelets 115 (L) 150 - 440 K/uL  Basic metabolic panel     Status: Abnormal   Collection Time: 03/03/16  3:27 AM  Result Value Ref Range   Sodium 138 135 - 145 mmol/L   Potassium 3.9 3.5 - 5.1 mmol/L   Chloride 106 101 - 111 mmol/L   CO2 26 22 - 32 mmol/L   Glucose, Bld 142 (H) 65 - 99 mg/dL   BUN 22 (H) 6 - 20 mg/dL   Creatinine, Ser 0.92 0.44 - 1.00 mg/dL   Calcium 8.0 (L) 8.9 - 10.3 mg/dL   GFR calc non Af Amer 51 (L) >60 mL/min   GFR calc Af  Amer 59 (L) >60 mL/min   Anion gap 6 5 - 15    US Venous Img Lower Unilateral Left  03/03/2016  CLINICAL DATA:  Chronic left lower extremity swelling for years. History of breast cancer. EXAM: LEFT LOWER EXTREMITY VENOUS DOPPLER ULTRASOUND TECHNIQUE: Gray-scale sonography with graded compression, as well as color Doppler and duplex ultrasound were performed to evaluate the lower extremity deep venous systems from the level of the common femoral vein and including the common femoral, femoral, profunda femoral, popliteal and calf veins including the posterior tibial, peroneal and gastrocnemius veins when visible. The superficial great saphenous vein was also interrogated. Spectral Doppler was utilized to evaluate flow at rest and with distal augmentation maneuvers in the common femoral, femoral and popliteal veins. COMPARISON:  None. FINDINGS: Contralateral Common Femoral Vein: Respiratory phasicity is normal and symmetric with the symptomatic side. No evidence of thrombus. Normal compressibility. Common Femoral Vein: No evidence of thrombus. Normal compressibility, respiratory phasicity and response to augmentation. Saphenofemoral Junction: No evidence of thrombus. Normal compressibility and flow on color Doppler imaging. Profunda  Femoral Vein: No evidence of thrombus. Normal compressibility and flow on color Doppler imaging. Femoral Vein: No evidence of thrombus. Normal compressibility, respiratory phasicity and response to augmentation. Popliteal Vein: No evidence of thrombus. Normal compressibility, respiratory phasicity and response to augmentation. Calf Veins: No evidence of thrombus. Normal compressibility and flow on color Doppler imaging. Superficial Great Saphenous Vein: No evidence of thrombus. Normal compressibility and flow on color Doppler imaging. Venous Reflux:  None. Other Findings:  None. IMPRESSION: No evidence of deep venous thrombosis. Electronically Signed   By: Marin Olp M.D.   On:  03/03/2016 09:58   Dg Hip Port Unilat With Pelvis 1v Right  03/02/2016  CLINICAL DATA:  Status post intra medullary nail placement. EXAM: DG HIP (WITH OR WITHOUT PELVIS) 1V PORT RIGHT COMPARISON:  Mar 01, 2016 FINDINGS: An intra medullary may nail has been placed across the right intertrochanteric fracture. Hardware is in good position. Alignment has significantly improved. No dislocation identified although the lateral image is limited. There appears to be an old fracture of the left greater trochanter, stable. No other acute abnormalities. IMPRESSION: Intra medullary nail placement across the right intertrochanteric fracture. Hardware is in good position. Electronically Signed   By: Dorise Bullion III M.D   On: 03/02/2016 13:10   Dg Hip Operative Unilat W Or W/o Pelvis Right  03/02/2016  CLINICAL DATA:  82 year old female with history of right-sided hip fracture. Status post ORIF. EXAM: OPERATIVE RIGHT HIP (WITH PELVIS IF PERFORMED) 4 VIEWS TECHNIQUE: Fluoroscopic spot image(s) were submitted for interpretation post-operatively. COMPARISON:  03/01/2016. FINDINGS: Four intraoperative spot views of the right hip document interval placement of a gamma nail fixation device traversing the previously noted comminuted intertrochanteric hip fracture, with restoration of normal anatomic alignment. Right hip is properly located. No acute complicating features. IMPRESSION: 1. Intraoperative documentation of ORIF for right hip fracture with restoration of anatomic alignment. Electronically Signed   By: Vinnie Langton M.D.   On: 03/02/2016 14:42    Assessment/Plan: 1 Day Post-Op   Active Problems:   Intertrochanteric fracture of right femur [facility])  Patient is doing well postop. Her Foley catheter is out. She will complete 24 hours of postop antibiotics. Continue physical and occupational therapy. Patient has tachycardia but has maintained her blood pressure. Her hemoglobin today is 8.8. She will have labs  drawn again tomorrow to continue to monitor her hemoglobin and hematocrit. She is afebrile. Continue current pain management.    Thornton Park , MD 03/03/2016, 1:02 PM

## 2016-03-03 NOTE — Clinical Social Work Note (Signed)
Clinical Social Work Assessment  Patient Details  Name: Jasmine Stokes MRN: [id number] Date of Birth: June 09, 1919  Date of referral:  03/03/16               Reason for consult:  Facility Placement                Permission sought to share information with:  Chartered certified accountant granted to share information::  Yes, Verbal Permission Granted  Name::      [place]::   [place]   Relationship::     Contact Information:     Housing/Transportation Living arrangements for the past 2 months:  Single Family Home Source of Information:  Patient, Engineer, materials, Other (Comment Required) (Niece and great niece ) Patient Interpreter Needed:  None Criminal Activity/Legal Involvement Pertinent to Current Situation/Hospitalization:  No - Comment as needed Significant Relationships:  Other Family Members Lives with:  Self Do you feel safe going back to the place where you live?  Yes Need for family participation in patient care:  Yes (Comment)  Care giving concerns:  Patient lives alone in [place].    Social Worker assessment / plan:  Holiday representative (CSW) received SNF consult. Patient had surgery yesterday. PT is recommending SNF. CSW met with patient and her niece Jasmine Stokes [phone] and great niece were at bedside. CSW introduced self and explained role of CSW department. Patient was sitting up in the chair and was slightly confused. Per niece patient is confused due to the pain medication and is usually alert and oriented. CSW explained SNF process. Niece is agreeable to SNF search in [place] and prefers [facility]. Per niece she works at [employer] Niece also inquired about getting a HPOA for patient. CSW explained that a chaplin consult can be put in for HPOA however if patient is confused then she will not be able to sign HPOA document. Niece verbalized her understanding and stated that patient has no children and she  has been caring for patient for the past 13 years.   FL2 complete and faxed out. CSW will continue to follow and assist as needed.   Employment status:  Disabled (Comment on whether or not currently receiving Disability), Retired Nurse, adult PT Recommendations:  [place] / Referral to community resources:  [place]  Patient/Family's Response to care:  Patient's niece is agreeable to [employer] and prefers [facility].   Patient/Family's Understanding of and Emotional Response to Diagnosis, Current Treatment, and Prognosis:  Niece Jasmine Stokes was pleasant and thanked CSW for visit.   Emotional Assessment Appearance:  Appears stated age Attitude/Demeanor/Rapport:  Lethargic Affect (typically observed):  Accepting, Pleasant Orientation:  Oriented to Self, Oriented to Place, Fluctuating Orientation (Suspected and/or reported Sundowners) Alcohol / Substance use:  Not Applicable Psych involvement (Current and /or in the community):  No (Comment)  Discharge Needs  Concerns to be addressed:  Discharge Planning Concerns Readmission within the last 30 days:  No Current discharge risk:  Dependent with Mobility Barriers to Discharge:  Continued Medical Work up   Loralyn Freshwater, LCSW 03/03/2016, 2:51 PM

## 2016-03-03 NOTE — Clinical Social Work Placement (Signed)
   CLINICAL SOCIAL WORK PLACEMENT  NOTE  Date:  03/03/2016  Patient Details  Name: Jasmine Stokes MRN: [id number] Date of Birth: [birth year]  Clinical Social Work is seeking post-discharge placement for this patient at the [place] level of care (*CSW will initial, date and re-position this form in  chart as items are completed):  Yes   Patient/family provided with [place] Work Department's list of facilities offering this level of care within the geographic area requested by the patient (or if unable, by the patient's family).  Yes   Patient/family informed of their freedom to choose among providers that offer the needed level of care, that participate in Medicare, Medicaid or managed care program needed by the patient, have an available bed and are willing to accept the patient.  Yes   Patient/family informed of [facility]'s ownership interest in [facility] and [facility], as well as of the fact that they are under no obligation to receive care at these facilities.  PASRR submitted to EDS on 03/03/16     PASRR number received on 03/03/16     Existing PASRR number confirmed on       FL2 transmitted to all facilities in geographic area requested by pt/family on 03/03/16     FL2 transmitted to all facilities within larger geographic area on       Patient informed that his/her managed care company has contracts with or will negotiate with certain facilities, including the following:            Patient/family informed of bed offers received.  Patient chooses bed at       Physician recommends and patient chooses bed at      Patient to be transferred to   on  .  Patient to be transferred to facility by       Patient family notified on   of transfer.  Name of family member notified:        PHYSICIAN       Additional Comment:    _______________________________________________ Loralyn Freshwater, LCSW 03/03/2016, 2:50 PM

## 2016-03-03 NOTE — Progress Notes (Signed)
Foley d/c'd at 0600 with 300cc output.

## 2016-03-03 NOTE — NC FL2 (Signed)
[place] LEVEL OF CARE SCREENING TOOL     IDENTIFICATION  Patient Name: Jasmine Stokes Birthdate: 1919/06/17 Sex: female Admission Date (Current Location): 03/01/2016  [place] and [place] Number:  Engineering geologist and Address:  [facility], [address] [place], [place] [address]      Provider Number: [id number]  Attending Physician Name and Address:  Theodoro Grist, MD  Relative Name and Phone Number:       Current Level of Care: Hospital Recommended Level of Care: [place] Prior Approval Number:    Date Approved/Denied:   PASRR Number:  ([id number])  Discharge Plan: SNF    Current Diagnoses: Patient Active Problem List   Diagnosis Date Noted  . Intertrochanteric fracture of right femur ([place]) 03/01/2016  History of breast cancer status post lumpectomy currently under remission no interventions needed at this time  . Hypertension   . Cancer ([place])          Orientation RESPIRATION BLADDER Height & Weight     Self, Time, Situation, Place  Normal Continent Weight: 88 lb (39.917 kg) Height:  5' (152.4 cm)  BEHAVIORAL SYMPTOMS/MOOD NEUROLOGICAL BOWEL NUTRITION STATUS   (none )  (none ) Incontinent Diet (Regular Diet )  AMBULATORY STATUS COMMUNICATION OF NEEDS Skin   Extensive Assist Verbally Surgical wounds (Incision: Left Hip. )                       Personal Care Assistance Level of Assistance  Bathing, Feeding, Dressing Bathing Assistance: Limited assistance Feeding assistance: Independent Dressing Assistance: Limited assistance     Functional Limitations Info  Sight, Hearing, Speech Sight Info: Adequate Hearing Info: Impaired Speech Info: Adequate    SPECIAL CARE FACTORS FREQUENCY  PT (By licensed PT), OT (By licensed OT)     PT Frequency:  (5) OT Frequency:  (5)            Contractures      Additional Factors Info  Code Status, Allergies Code Status Info:  (Full  Code. ) Allergies Info:  (No Known Allergies )           Current Medications (03/03/2016):  This is the current hospital active medication list Current Facility-Administered Medications  Medication Dose Route Frequency Provider Last Rate Last Dose  . 0.9 %  sodium chloride infusion  75 mL/hr Intravenous Continuous Theodoro Grist, MD 75 mL/hr at 03/03/16 0154 75 mL/hr at 03/03/16 0154  . acetaminophen (TYLENOL) tablet 650 mg  650 mg Oral Q6H PRN Thornton Park, MD   650 mg at 03/03/16 1027   Or  . acetaminophen (TYLENOL) suppository 650 mg  650 mg Rectal Q6H PRN Thornton Park, MD      . ALPRAZolam Duanne Moron) tablet 0.25 mg  0.25 mg Oral QHS PRN Nicholes Mango, MD   0.25 mg at 03/02/16 2200  . alum & mag hydroxide-simeth (MAALOX/MYLANTA) 200-200-20 MG/5ML suspension 30 mL  30 mL Oral Q4H PRN Thornton Park, MD      . bisacodyl (DULCOLAX) suppository 10 mg  10 mg Rectal Daily PRN Thornton Park, MD      . docusate sodium (COLACE) capsule 100 mg  100 mg Oral BID Thornton Park, MD   100 mg at 03/03/16 1002  . enoxaparin (LOVENOX) injection 30 mg  30 mg Subcutaneous Q24H Thornton Park, MD   30 mg at 03/03/16 1003  . ferrous sulfate tablet 325 mg  325 mg Oral TID PC Thornton Park, MD  325 mg at 03/03/16 1236  . magnesium citrate solution 1 Bottle  1 Bottle Oral Once PRN Thornton Park, MD      . menthol-cetylpyridinium (CEPACOL) lozenge 3 mg  1 lozenge Oral PRN Thornton Park, MD       Or  . phenol (CHLORASEPTIC) mouth spray 1 spray  1 spray Mouth/Throat PRN Thornton Park, MD      . metoprolol tartrate (LOPRESSOR) tablet 12.5 mg  12.5 mg Oral BID Theodoro Grist, MD   12.5 mg at 03/03/16 1236  . morphine 2 MG/ML injection 2 mg  2 mg Intravenous Q2H PRN Thornton Park, MD      . ondansetron [facility]) tablet 4 mg  4 mg Oral Q6H PRN Thornton Park, MD       Or  . ondansetron [facility]) injection 4 mg  4 mg Intravenous Q6H PRN Thornton Park, MD      . oxyCODONE (Oxy IR/ROXICODONE) immediate  release tablet 5-10 mg  5-10 mg Oral Q4H PRN Thornton Park, MD   5 mg at 03/03/16 1027  . polyethylene glycol (MIRALAX / GLYCOLAX) packet 17 g  17 g Oral Daily PRN Thornton Park, MD      . [facility]) tablet 8.6 mg  1 tablet Oral BID Thornton Park, MD   8.6 mg at 03/03/16 1003     Discharge Medications: Please see discharge summary for a list of discharge medications.  Relevant Imaging Results:  Relevant Lab Results:   Additional Information  (SSN: [id number])  Loralyn Freshwater, LCSW

## 2016-03-03 NOTE — Plan of Care (Signed)
Problem: Acute Rehab PT Goals(only PT should resolve) Goal: Pt Will Transfer Bed To Chair/Chair To Bed Pt will transfer sit to/from-stand with RW at ModI without loss-of-balance to demonstrate good safety awareness for independent mobility in home.     Goal: Pt Will Ambulate Pt will ambulate with RW at Supervision using a step-through pattern and equal step length and PWB for a distance greater than 72ft to demonstrate the ability to perform safe household distance ambulation at discharge.

## 2016-03-03 NOTE — Progress Notes (Signed)
[facility] [place] NAME: Jasmine Stokes    MR#:  [id number]  DATE OF BIRTH:  1919/04/19  SUBJECTIVE:  CHIEF COMPLAINT:   Chief Complaint  Patient presents with  . Fall    Had right hip fracture, status post Intramedullary nail placement for right intertrochanteric fracture on the  13th of May 2017.   No complaints currently, Denies significant pain. Admits of some discomfort in right side of the face after contusion. Overall feels good    REVIEW OF SYSTEMS:  CONSTITUTIONAL: No fever, fatigue or weakness.  EYES: No blurred or double vision.  EARS, NOSE, AND THROAT: No tinnitus or ear pain.  RESPIRATORY: No cough, shortness of breath, wheezing or hemoptysis.  CARDIOVASCULAR: No chest pain, orthopnea, edema.  GASTROINTESTINAL: No nausea, vomiting, diarrhea or abdominal pain.  GENITOURINARY: No dysuria, hematuria.  ENDOCRINE: No polyuria, nocturia,  HEMATOLOGY: No anemia, easy bruising or bleeding SKIN: No rash or lesion. MUSCULOSKELETAL: No joint pain or arthritis.   NEUROLOGIC: No tingling, numbness, weakness.  PSYCHIATRY: No anxiety or depression.   ROS  DRUG ALLERGIES:  No Known Allergies  VITALS:  Blood pressure 146/74, pulse 100, temperature 98.6 F (37 C), temperature source Oral, resp. rate 16, height 5' (1.524 m), weight 39.917 kg (88 lb), SpO2 95 %.  PHYSICAL EXAMINATION:  GENERAL:  82 y.o.-year-old patient lying in the bed with no acute distress.  EYES: Pupils equal, round, reactive to light and accommodation. No scleral icterus. Extraocular muscles intact.  HEENT: Head atraumatic, normocephalic. Oropharynx and nasopharynx clear. Right cheek and the right side of the neck is bruised somewhat tender to palpation. No crepitations  NECK:  Supple, no jugular venous distention. No thyroid enlargement, no tenderness.  LUNGS: Normal breath sounds bilaterally, no wheezing, rales,rhonchi or crepitation. No use of accessory muscles  of respiration.  CARDIOVASCULAR: S1, S2 normal. No murmurs, rubs, or gallops.  ABDOMEN: Soft, nontender, nondistended. Bowel sounds present. No organomegaly or mass.  EXTREMITIES:1+ left lower extremity and pedal edema, . No cyanosis, or clubbing. Dressing on the right lower extremity incision area at the right hip. Some upper thigh  swelling on the right .  NEUROLOGIC: Cranial nerves II through XII are intact. Muscle strength 5/5 in all extremities. Sensation intact. Gait not checked.  PSYCHIATRIC: The patient is alert and oriented x 3.  SKIN: No obvious rash, lesion, or ulcer.   Physical Exam LABORATORY PANEL:   CBC  Recent Labs Lab 03/03/16 0327  WBC 8.9  HGB 8.8*  HCT 26.4*  PLT 115*   ------------------------------------------------------------------------------------------------------------------  Chemistries   Recent Labs Lab 03/02/16 0409 03/03/16 0327  NA 139 138  K 4.3 3.9  CL 106 106  CO2 28 26  GLUCOSE 108* 142*  BUN 22* 22*  CREATININE 0.82 0.92  CALCIUM 8.4* 8.0*  AST 31  --   ALT 14  --   ALKPHOS 62  --   BILITOT 1.9*  --    ------------------------------------------------------------------------------------------------------------------  Cardiac Enzymes No results for input(s): TROPONINI in the last 168 hours. ------------------------------------------------------------------------------------------------------------------  RADIOLOGY:  US Venous Img Lower Unilateral Left  03/03/2016  CLINICAL DATA:  Chronic left lower extremity swelling for years. History of breast cancer. EXAM: LEFT LOWER EXTREMITY VENOUS DOPPLER ULTRASOUND TECHNIQUE: Gray-scale sonography with graded compression, as well as color Doppler and duplex ultrasound were performed to evaluate the lower extremity deep venous systems from the level of the common femoral vein and including the common femoral, femoral, profunda femoral, popliteal  and calf veins including the posterior tibial,  peroneal and gastrocnemius veins when visible. The superficial great saphenous vein was also interrogated. Spectral Doppler was utilized to evaluate flow at rest and with distal augmentation maneuvers in the common femoral, femoral and popliteal veins. COMPARISON:  None. FINDINGS: Contralateral Common Femoral Vein: Respiratory phasicity is normal and symmetric with the symptomatic side. No evidence of thrombus. Normal compressibility. Common Femoral Vein: No evidence of thrombus. Normal compressibility, respiratory phasicity and response to augmentation. Saphenofemoral Junction: No evidence of thrombus. Normal compressibility and flow on color Doppler imaging. Profunda Femoral Vein: No evidence of thrombus. Normal compressibility and flow on color Doppler imaging. Femoral Vein: No evidence of thrombus. Normal compressibility, respiratory phasicity and response to augmentation. Popliteal Vein: No evidence of thrombus. Normal compressibility, respiratory phasicity and response to augmentation. Calf Veins: No evidence of thrombus. Normal compressibility and flow on color Doppler imaging. Superficial Great Saphenous Vein: No evidence of thrombus. Normal compressibility and flow on color Doppler imaging. Venous Reflux:  None. Other Findings:  None. IMPRESSION: No evidence of deep venous thrombosis. Electronically Signed   By: Marin Olp M.D.   On: 03/03/2016 09:58   Dg Hip Port Unilat With Pelvis 1v Right  03/02/2016  CLINICAL DATA:  Status post intra medullary nail placement. EXAM: DG HIP (WITH OR WITHOUT PELVIS) 1V PORT RIGHT COMPARISON:  Mar 01, 2016 FINDINGS: An intra medullary may nail has been placed across the right intertrochanteric fracture. Hardware is in good position. Alignment has significantly improved. No dislocation identified although the lateral image is limited. There appears to be an old fracture of the left greater trochanter, stable. No other acute abnormalities. IMPRESSION: Intra medullary  nail placement across the right intertrochanteric fracture. Hardware is in good position. Electronically Signed   By: Dorise Bullion III M.D   On: 03/02/2016 13:10   Dg Hip Operative Unilat W Or W/o Pelvis Right  03/02/2016  CLINICAL DATA:  82 year old female with history of right-sided hip fracture. Status post ORIF. EXAM: OPERATIVE RIGHT HIP (WITH PELVIS IF PERFORMED) 4 VIEWS TECHNIQUE: Fluoroscopic spot image(s) were submitted for interpretation post-operatively. COMPARISON:  03/01/2016. FINDINGS: Four intraoperative spot views of the right hip document interval placement of a gamma nail fixation device traversing the previously noted comminuted intertrochanteric hip fracture, with restoration of normal anatomic alignment. Right hip is properly located. No acute complicating features. IMPRESSION: 1. Intraoperative documentation of ORIF for right hip fracture with restoration of anatomic alignment. Electronically Signed   By: Vinnie Langton M.D.   On: 03/02/2016 14:42    ASSESSMENT AND PLAN:   Active Problems:   Intertrochanteric fracture of right femur (HCC)   #Right hip intertrochanteric fracture S/p Intramedullary nail placement due to Intratrochanteric right hip fracture . Pain managementis adequate. PT evaluation is pending, likely discharged to skilled nursing facility within next few days.    #Essential hypertension, Hold Norvasc, initiate patient on low-dose of metoprolol due to tachycardia. No hypoxia noted. Patient's IV fluids were advanced and her heart rate improved some with IV fluid advancement, possible dehydration  #History of anxiety resume home medications Xanax  #Nasal bone fracture from the fall Pain management as needed  #History of breast cancer status post lumpectomy currently under remission no interventions needed at this time  # Left lower extremity swelling, no DVT on the Doppler ultrasound, supportive therapy with TED hoses. Patient is not hypoxic, although  tachycardic, get TSH  # Sinus tachycardia, get TSH, hemoglobin level tomorrow morning, transfuse as needed, initiate  patient on low-dose of metoprolol, patient does have some risks  for pulmonary embolism, however, patient's Doppler ultrasound of left lower extremity is negative for DVT. Patient is not hypoxic as well, hold off CT angiogram of the chest. Follow clinically   All the records are reviewed and case discussed with Care Management/Social Workerr. Management plans discussed with the patient, family and they are in agreement.  TOTAL TIME TAKING CARE OF THIS PATIENT: 35 minutes.     POSSIBLE D/C IN 2 DAYS, DEPENDING ON CLINICAL CONDITION.   Theodoro Grist M.D on 03/03/2016   Between 7am to 6pm - Pager - [phone]  After 6pm go to [email or web address] - password [email or web address] [facility]  Office  [phone]  CC: Primary care physician; No primary care provider on file.  Note: This dictation was prepared with Dragon dictation along with smaller phrase technology. Any transcriptional errors that result from this process are unintentional.

## 2016-03-03 NOTE — Care Management Note (Addendum)
Case Management Note  Patient Details  Name: Jasmine Stokes MRN: [id number] Date of Birth: 03-Oct-1919  Subjective/Objective:   82yo [name] was admitted from home with a right hip fracture after she fell on her walker. Dr Mack Guise provided a right hip repair on 03/02/16. Daughter works at [employer] and family is requesting rehab there. PCP=Dr Walker. Pharmacy=[facility] on [address]. Lovenox not called in per SNF placement. No home oxygen and no current home health services. Daughter and granddaughter are 24/7 caretakers. No further case management needs and [facility]-CSW is following for placement.                   Action/Plan:   Expected Discharge Date:  03/04/16               Expected Discharge Plan:     In-House Referral:     Discharge planning Services     Post Acute Care Choice:    Choice offered to:     DME Arranged:    DME Agency:     HH Arranged:    HH Agency:     Status of Service:     Medicare Important Message Given:    Date Medicare IM Given:    Medicare IM give by:    Date Additional Medicare IM Given:    Additional Medicare Important Message give by:     If discussed at [place] of Stay Meetings, dates discussed:    Additional Comments:  Nikoleta Dady A, RN 03/03/2016, 3:30 PM

## 2016-03-03 NOTE — Evaluation (Signed)
Physical Therapy Evaluation Patient Details Name: Jasmine Stokes MRN: [id number] DOB: [birth year] Today's Date: 03/03/2016   History of Present Illness  82yo white female sustained a fall at home, whence rollator was to have been locked, howevere to her surprise was not. She then crawled to th eback door to call for help. she wa sfoudn to have sustained a R hip fracture, along with some trauma to her face, RUE, and other areas. She presents today on POD1. PT eval was inititated earlier todya, but held by hospitalist to r/o DVT., which has since been done. At baseline, pt lives alone, indep in ADL, household AMB modI with rollator, and limited community distances with rollator. She still makes groceries with daughter, but says it wears her out. She still performs all of her housework.   Clinical Impression  At evaluation, pt is received semirecumbent in bed upon entry, family/caregiver present. The pt is awake and agreeable to participate. No acute distress noted at this time.  VSS. The pt is alert and oriented x3, pleasant, conversational, and following simple and multi-step commands consistently. Pt grip strength is strong and symmetrical; global strength as screened during functional mobility assessment presents with mild impairment, the pt now requiring physical assistance for bed mobility, transfers, and gait, whereas the patient performs these indep at baseline.    Patient presenting with impairment of strength, range of motion, and activity tolerance, limiting ability to perform ADL and mobility tasks at  baseline level of function. Patient will benefit from skilled intervention to address the above impairments and limitations, in order to restore to prior level of function, improve patient safety upon discharge, and to decrease falls risk.       Follow Up Recommendations SNF (Family refers [facility] if available. )    Equipment Recommendations  None recommended by PT     Recommendations for Other Services       Precautions / Restrictions Precautions Precautions: Fall Restrictions Weight Bearing Restrictions: Yes RLE Weight Bearing: Partial weight bearing      Mobility  Bed Mobility Overal bed mobility: Needs Assistance Bed Mobility: Supine to Sit     Supine to sit: Mod assist     General bed mobility comments: Moves well, but very uncoordinated and inefficient, moving more toward the foot of bed than to the EOB.   Transfers Overall transfer level: Needs assistance Equipment used: Rolling walker (2 wheeled) Transfers: Sit to/from Stand Sit to Stand: Min guard         General transfer comment: Good strength, stands tall and quickly with minimal hesitation.   Ambulation/Gait Ambulation/Gait assistance:  (takes some steps from bedside to chair, for trqansfer. Requires heavy verbal cues to perform. Appears to be compliant with weight bearing status. )              Stairs            Wheelchair Mobility    Modified Rankin (Stroke Patients Only)       Balance Overall balance assessment: Needs assistance Sitting-balance support: Feet supported;No upper extremity supported Sitting balance-Leahy Scale: Good     Standing balance support: Bilateral upper extremity supported Standing balance-Leahy Scale: Fair                               Pertinent Vitals/Pain Pain Assessment: Faces (reports no pain at rest, some concerning pain with AMB, but does not rate numerically. ) Faces Pain Scale:  Hurts even more Pain Location: R hip. Mostly pain free during therex as well.  Pain Descriptors / Indicators: Aching Pain Intervention(s): Limited activity within patient's tolerance;Monitored during session;Premedicated before session    Home Living Family/patient expects to be discharged to:: Skilled nursing facility Living Arrangements: Alone                    Prior Function           Comments: *see note  summary      Hand Dominance   Dominant Hand: Right    Extremity/Trunk Assessment   Upper Extremity Assessment: Overall WFL for tasks assessed (Elbow extension 5/5 bilat. )           Lower Extremity Assessment: Generalized weakness;RLE deficits/detail RLE Deficits / Details: postop weakness and inhibition     Cervical / Trunk Assessment: Kyphotic  Communication   Communication: No difficulties;HOH  Cognition Arousal/Alertness: Awake/alert Behavior During Therapy: WFL for tasks assessed/performed Overall Cognitive Status: Within Functional Limits for tasks assessed                      General Comments      Exercises Other Exercises Other Exercises: Ankle Pumps, bilat x20, excellent range Other Exercises: Heel slides RLE x15, good strength and range Other Exercises: Abduction/adduction slides RLE x15, good range  Other Exercises: SLR, SAQ x15 RLE, range is good, strength is good but inhibited by pain.  Other Exercises: Bridging x10, good range and strength, quite asymmetrical in pelvis upon rise.       Assessment/Plan    PT Assessment Patient needs continued PT services  PT Diagnosis Difficulty walking;Generalized weakness;Acute pain;Abnormality of gait   PT Problem List Decreased strength;Decreased range of motion;Decreased activity tolerance;Decreased balance;Decreased mobility;Decreased coordination;Decreased knowledge of use of DME  PT Treatment Interventions DME instruction;Gait training;Stair training;Functional mobility training;Therapeutic activities;Therapeutic exercise;Balance training;Patient/family education;Manual techniques   PT Goals (Current goals can be found in the Care Plan section) Acute Rehab PT Goals Patient Stated Goal: Return to PLOF.  PT Goal Formulation: With patient/family Time For Goal Achievement: 03/17/16 Potential to Achieve Goals: Good    Frequency BID   Barriers to discharge Decreased caregiver support;Inaccessible home  environment      Co-evaluation               End of Session Equipment Utilized During Treatment: Gait belt Activity Tolerance: Patient tolerated treatment well;Patient limited by fatigue;Patient limited by pain Patient left: in chair;with call bell/phone within reach;with family/visitor present;with chair alarm set Nurse Communication: Mobility status;Other (comment);Weight bearing status         Time: 1349-1420 PT Time Calculation (min) (ACUTE ONLY): 31 min   Charges:   PT Evaluation $PT Eval Low Complexity: 1 Procedure PT Treatments $Therapeutic Exercise: 8-22 mins   PT G Codes:        2:46 PM, 23-Mar-2016 Etta Grandchild, PT, DPT PRN Physical Therapist - [place] License # [id number] [phone] (mobile)

## 2016-03-04 ENCOUNTER — Encounter: Payer: Self-pay | Admitting: Orthopedic Surgery

## 2016-03-04 LAB — BASIC METABOLIC PANEL
ANION GAP: 4 — AB (ref 5–15)
BUN: 22 mg/dL — ABNORMAL HIGH (ref 6–20)
CALCIUM: 7.9 mg/dL — AB (ref 8.9–10.3)
CO2: 28 mmol/L (ref 22–32)
Chloride: 111 mmol/L (ref 101–111)
Creatinine, Ser: 0.82 mg/dL (ref 0.44–1.00)
GFR calc non Af Amer: 59 mL/min — ABNORMAL LOW (ref >60)
Glucose, Bld: 91 mg/dL (ref 65–99)
POTASSIUM: 3.7 mmol/L (ref 3.5–5.1)
Sodium: 143 mmol/L (ref 135–145)

## 2016-03-04 LAB — CBC
HEMATOCRIT: 21.3 % — AB (ref 35.0–47.0)
HEMOGLOBIN: 7.2 g/dL — AB (ref 12.0–16.0)
MCH: 30.6 pg (ref 26.0–34.0)
MCHC: 33.8 g/dL (ref 32.0–36.0)
MCV: 90.7 fL (ref 80.0–100.0)
Platelets: 97 10*3/uL — ABNORMAL LOW (ref 150–440)
RBC: 2.34 MIL/uL — AB (ref 3.80–5.20)
RDW: 13.6 % (ref 11.5–14.5)
WBC: 4.6 10*3/uL (ref 3.6–11.0)

## 2016-03-04 LAB — HEMOGLOBIN AND HEMATOCRIT, BLOOD
HEMATOCRIT: 25.5 % — AB (ref 35.0–47.0)
HEMOGLOBIN: 8.5 g/dL — AB (ref 12.0–16.0)

## 2016-03-04 LAB — PREPARE RBC (CROSSMATCH)

## 2016-03-04 MED ORDER — SODIUM CHLORIDE 0.9 % IV SOLN
Freq: Once | INTRAVENOUS | Status: AC
  Administered 2016-03-04: 10:00:00 via INTRAVENOUS

## 2016-03-04 MED ORDER — ACETAMINOPHEN 325 MG PO TABS
650.0000 mg | ORAL_TABLET | Freq: Once | ORAL | Status: DC
Start: 2016-03-04 — End: 2016-03-05

## 2016-03-04 MED ORDER — DIPHENHYDRAMINE HCL 25 MG PO CAPS
25.0000 mg | ORAL_CAPSULE | Freq: Once | ORAL | Status: AC
  Administered 2016-03-04: 25 mg via ORAL
  Filled 2016-03-04: qty 1

## 2016-03-04 MED ORDER — ENSURE ENLIVE PO LIQD
237.0000 mL | Freq: Two times a day (BID) | ORAL | Status: DC
  Administered 2016-03-04 – 2016-03-05 (×3): 237 mL via ORAL

## 2016-03-04 MED ORDER — SODIUM CHLORIDE 0.9 % IV SOLN: Freq: Once | INTRAVENOUS | Status: DC

## 2016-03-04 MED ORDER — ACETAMINOPHEN 325 MG PO TABS
650.0000 mg | ORAL_TABLET | Freq: Once | ORAL | Status: AC
  Administered 2016-03-04: 650 mg via ORAL
  Filled 2016-03-04: qty 2

## 2016-03-04 NOTE — Discharge Instructions (Signed)
Patient will be partial weightbearing on the right lower extremity until her follow-up in the office in 2 weeks. Patient should continue to receive physical therapy for right hip and lower extremity range of motion, strengthening and gait training. She should elevate the right lower extremity whenever possible. She may apply ice to the surgical site to help reduce swelling. Dressing should be changed when necessary basis if drainage is observed. Continue DVT prophylaxis for 4-6 weeks postop. Patient will follow with orthopedic surgeon in his office in 10-14 days. Staples will be removed in the orthopedic office.  [facility], Dr. Mack Guise, [phone]

## 2016-03-04 NOTE — Progress Notes (Signed)
Subjective:  POD #2  is post intramedullary fixation for right intertrochanteric hip fracture. Patient is lying in bed and appears comfortable. Patient reports right hip pain as mild.  She is completed for transfusion of 1 unit of packed red blood cells.  Objective:   VITALS:   Filed Vitals:   03/04/16 0457 03/04/16 0718 03/04/16 0942 03/04/16 1005  BP: 113/45 109/46 101/45 103/40  Pulse: 85 91 83 83  Temp: 98.3 F (36.8 C) 98.4 F (36.9 C) 98.3 F (36.8 C) 98.1 F (36.7 C)  TempSrc: Oral Oral Oral   Resp: 16 16 16 16   Height:      Weight:      SpO2: 95% 100% 96% 96%    PHYSICAL EXAM:  Right lower extremity: Patient is right hip dressing has scant serosanguineous drainage.  Her thigh has ecchymosis and mild swelling but her compartments are soft and compressible. There is no erythema. He has intact sensation to light touch throughout the right lower extremity, palpable pedal pulses and intact motor function.  LABS  Results for orders placed or performed during the hospital encounter of 03/01/16 (from the past 24 hour(s))  TSH     Status: None   Collection Time: 03/03/16 12:47 PM  Result Value Ref Range   TSH 1.075 0.350 - 4.500 uIU/mL  Hemoglobin     Status: Abnormal   Collection Time: 03/03/16 12:47 PM  Result Value Ref Range   Hemoglobin 9.1 (L) 12.0 - 16.0 g/dL  CBC     Status: Abnormal   Collection Time: 03/04/16  2:57 AM  Result Value Ref Range   WBC 4.6 3.6 - 11.0 K/uL   RBC 2.34 (L) 3.80 - 5.20 MIL/uL   Hemoglobin 7.2 (L) 12.0 - 16.0 g/dL   HCT 21.3 (L) 35.0 - 47.0 %   MCV 90.7 80.0 - 100.0 fL   MCH 30.6 26.0 - 34.0 pg   MCHC 33.8 32.0 - 36.0 g/dL   RDW 13.6 11.5 - 14.5 %   Platelets 97 (L) 150 - 440 K/uL  Basic metabolic panel     Status: Abnormal   Collection Time: 03/04/16  2:57 AM  Result Value Ref Range   Sodium 143 135 - 145 mmol/L   Potassium 3.7 3.5 - 5.1 mmol/L   Chloride 111 101 - 111 mmol/L   CO2 28 22 - 32 mmol/L   Glucose, Bld 91 65 - 99  mg/dL   BUN 22 (H) 6 - 20 mg/dL   Creatinine, Ser 0.82 0.44 - 1.00 mg/dL   Calcium 7.9 (L) 8.9 - 10.3 mg/dL   GFR calc non Af Amer 59 (L) >60 mL/min   GFR calc Af Amer >60 >60 mL/min   Anion gap 4 (L) 5 - 15  Prepare RBC     Status: None   Collection Time: 03/04/16  7:30 AM  Result Value Ref Range   Order Confirmation ORDER PROCESSED BY BLOOD BANK     US Venous Img Lower Unilateral Left  03/03/2016  CLINICAL DATA:  Chronic left lower extremity swelling for years. History of breast cancer. EXAM: LEFT LOWER EXTREMITY VENOUS DOPPLER ULTRASOUND TECHNIQUE: Gray-scale sonography with graded compression, as well as color Doppler and duplex ultrasound were performed to evaluate the lower extremity deep venous systems from the level of the common femoral vein and including the common femoral, femoral, profunda femoral, popliteal and calf veins including the posterior tibial, peroneal and gastrocnemius veins when visible. The superficial great saphenous vein was also interrogated.  Spectral Doppler was utilized to evaluate flow at rest and with distal augmentation maneuvers in the common femoral, femoral and popliteal veins. COMPARISON:  None. FINDINGS: Contralateral Common Femoral Vein: Respiratory phasicity is normal and symmetric with the symptomatic side. No evidence of thrombus. Normal compressibility. Common Femoral Vein: No evidence of thrombus. Normal compressibility, respiratory phasicity and response to augmentation. Saphenofemoral Junction: No evidence of thrombus. Normal compressibility and flow on color Doppler imaging. Profunda Femoral Vein: No evidence of thrombus. Normal compressibility and flow on color Doppler imaging. Femoral Vein: No evidence of thrombus. Normal compressibility, respiratory phasicity and response to augmentation. Popliteal Vein: No evidence of thrombus. Normal compressibility, respiratory phasicity and response to augmentation. Calf Veins: No evidence of thrombus. Normal  compressibility and flow on color Doppler imaging. Superficial Great Saphenous Vein: No evidence of thrombus. Normal compressibility and flow on color Doppler imaging. Venous Reflux:  None. Other Findings:  None. IMPRESSION: No evidence of deep venous thrombosis. Electronically Signed   By: Marin Olp M.D.   On: 03/03/2016 09:58   Dg Hip Port Unilat With Pelvis 1v Right  03/02/2016  CLINICAL DATA:  Status post intra medullary nail placement. EXAM: DG HIP (WITH OR WITHOUT PELVIS) 1V PORT RIGHT COMPARISON:  Mar 01, 2016 FINDINGS: An intra medullary may nail has been placed across the right intertrochanteric fracture. Hardware is in good position. Alignment has significantly improved. No dislocation identified although the lateral image is limited. There appears to be an old fracture of the left greater trochanter, stable. No other acute abnormalities. IMPRESSION: Intra medullary nail placement across the right intertrochanteric fracture. Hardware is in good position. Electronically Signed   By: Dorise Bullion III M.D   On: 03/02/2016 13:10    Assessment/Plan: 2 Days Post-Op   Active Problems:   Intertrochanteric fracture of right femur ([place])  I agree with the plan to give a unit of packed red blood cells. Patient should have her hematocrit checked again later this afternoon. If she continues to have a hemoglobin less than 8 she should be transfused another unit. Labs should also be redrawn in the morning. Patient be kept overnight for monitoring. If her hemoglobin and hematocrit are stable tomorrow she will be discharged to rehabilitation. Patient will be partial weightbearing on the right lower extremity until follow-up in my office in 2 weeks. She should continue TED stockings until her follow-up.    Thornton Park , MD 03/04/2016, 12:30 PM

## 2016-03-04 NOTE — Progress Notes (Signed)
OT Cancellation Note  Patient Details Name: ANTIONETTA HUMENIK MRN: [id number] DOB: 09/28/1919   Cancelled Treatment:    Reason Eval/Treat Not Completed: Medical issues which prohibited therapy (Pt. hemoglobin is 7.2. Pt. is receiving a blood transfusion. Will continue to monitor, and eval as appropriate.)   Harrel Carina, MS, OTR/L  Harrel Carina 03/04/2016, 10:23 AM

## 2016-03-04 NOTE — Progress Notes (Addendum)
Clinical Education officer, museum (CSW) contacted patient's niece Rise Paganini and presented bed offers. Niece chose [facility]. Deborah admissions coordinator at [facility] is aware of accepted bed offer. CSW will continue to follow and assist as needed.   Blima Rich, LCSW [phone]

## 2016-03-04 NOTE — Progress Notes (Signed)
Initial Nutrition Assessment     INTERVENTION:  -Monitor intake and cater to pt preferences -Recommend Ensure Enlive po BID, each supplement provides 350 kcal and 20 grams of protein -Discussed well balanced diet with pt and dtr at bedside    NUTRITION DIAGNOSIS:   Increased nutrient needs related to acute illness as evidenced by estimated needs.    GOAL:   Patient will meet greater than or equal to 90% of their needs    MONITOR:   PO intake, Supplement acceptance  REASON FOR ASSESSMENT:   Consult Poor PO  ASSESSMENT:   82 y/o female admitted after fall. S/p right hip fracture.  Past Medical History  Diagnosis Date  . Hypertension   . Cancer ([place])      Pt reports ate most of eggs this am and 100% of bacon, drank some of juice and coffee. Reports PTA eats cereal or toast and coffee for breakfast, light lunch and mostly vegetables for dinner.  Does not eat much meat. Reports drinks ensure.  Medications reviewed: colace, senna, fe sulfate, NS at 70ml/hr Labs reviewed: Hgb 7.2   Nutrition-Focused physical exam completed. Findings are no fat depletion, moderate muscle depletion, and no edema.     Diet Order:  Diet regular Room service appropriate?: Yes; Fluid consistency:: Thin  Skin:  Reviewed, no issues  Last BM:  5/14  Height:   Ht Readings from Last 1 Encounters:  03/01/16 5' (1.524 m)    Weight: Dtr reports stable wt prior to admission  Wt Readings from Last 1 Encounters:  03/01/16 88 lb (39.917 kg)    Ideal Body Weight:     BMI:  Body mass index is 17.19 kg/(m^2).  Estimated Nutritional Needs:   Kcal:  1200-1400 kcals/d  Protein:  60-70 g/d  Fluid:  1200-1477ml/d  EDUCATION NEEDS:   Education needs addressed  Eilidh Marcano B. Zenia Resides, [place], [place] (pager) Weekend/On-Call pager [phone])

## 2016-03-04 NOTE — Progress Notes (Signed)
Hemoglobin 7.2. Dr. Estanislado Pandy notified and no new orders received.

## 2016-03-04 NOTE — Progress Notes (Signed)
Physical Therapy Treatment Patient Details Name: Jasmine Stokes MRN: [id number] DOB: 03-14-1919 Today's Date: 03/04/2016    History of Present Illness 82yo white female sustained a fall at home, whence rollator was to have been locked, howevere to her surprise was not. She then crawled to th eback door to call for help. she wa sfoudn to have sustained a R hip fracture, along with some trauma to her face, RUE, and other areas. She presents today on POD1. PT eval was inititated earlier todya, but held by hospitalist to r/o DVT., which has since been done. At baseline, pt lives alone, indep in ADL, household AMB modI with rollator, and limited community distances with rollator. She still makes groceries with daughter, but says it wears her out. She still performs all of her housework.     PT Comments    Pt resting in bed but awoke easily ready for session.  To edge of bed with mod assist.  Sitting edge of bed with min guard for safety.  Complained of some dizziness upon sitting but eased with time.  Sit stood with min guard x 1 and was able to take a few steps to chair at bedside with min guard x 1 PWB on RLE.  She participated in gentle seated AROM as described below to increase comfort.     Follow Up Recommendations  SNF     Equipment Recommendations  None recommended by PT    Recommendations for Other Services       Precautions / Restrictions Restrictions Weight Bearing Restrictions: Yes RLE Weight Bearing: Partial weight bearing    Mobility  Bed Mobility Overal bed mobility: Needs Assistance Bed Mobility: Supine to Sit     Supine to sit: Mod assist     General bed mobility comments: verbal cues for hand placements  Transfers Overall transfer level: Needs assistance Equipment used: Rolling walker (2 wheeled) Transfers: Sit to/from Stand;Stand Pivot Transfers Sit to Stand: Min assist Stand pivot transfers: Min assist          Ambulation/Gait Ambulation/Gait  assistance: Min assist Ambulation Distance (Feet): 3 Feet Assistive device: Rolling walker (2 wheeled) Gait Pattern/deviations: Step-to pattern;Decreased stance time - right Gait velocity: slow Gait velocity interpretation: <1.8 ft/sec, indicative of risk for recurrent falls     Stairs            Wheelchair Mobility    Modified Rankin (Stroke Patients Only)       Balance Overall balance assessment: Needs assistance Sitting-balance support: Feet supported Sitting balance-Leahy Scale: Good     Standing balance support: Bilateral upper extremity supported Standing balance-Leahy Scale: Fair                      Cognition Arousal/Alertness: Awake/alert Behavior During Therapy: WFL for tasks assessed/performed Overall Cognitive Status: Within Functional Limits for tasks assessed                      Exercises Other Exercises Other Exercises: Ankle Pumps, bilat x20, excellent range Other Exercises: LAQ x 20 Other Exercises: AB/Adduction in sitting x 10    General Comments        Pertinent Vitals/Pain Pain Assessment: 0-10 Pain Score: 2  Pain Location: R Hip Pain Descriptors / Indicators: Aching Pain Intervention(s): Limited activity within patient's tolerance    Home Living                      Prior Function  PT Goals (current goals can now be found in the care plan section) Progress towards PT goals: Progressing toward goals    Frequency  BID    PT Plan Current plan remains appropriate    Co-evaluation             End of Session Equipment Utilized During Treatment: Gait belt Activity Tolerance: Patient tolerated treatment well;Patient limited by fatigue;Patient limited by pain Patient left: in chair;with call bell/phone within reach;with family/visitor present;with chair alarm set     Time: [id number] PT Time Calculation (min) (ACUTE ONLY): 18 min  Charges:  $Gait Training: 8-22 mins $Therapeutic  Exercise: 8-22 mins                    G Codes:      Chesley Noon, PTA 03/04/2016, 3:14 PM

## 2016-03-04 NOTE — Progress Notes (Signed)
[facility] NAME: Jasmine Stokes    MR#:  [id number]  DATE OF BIRTH:  January 05, 1919  SUBJECTIVE:  CHIEF COMPLAINT:   Chief Complaint  Patient presents with  . Fall    Had right hip fracture, status post Intramedullary nail placement for right intertrochanteric fracture on the  13th of May 2017.   No complaints currently, Admits of some pain in the operated extremity. Confused today, tangled in her gown  REVIEW OF SYSTEMS:  CONSTITUTIONAL: No fever, fatigue or weakness.  EYES: No blurred or double vision.  EARS, NOSE, AND THROAT: No tinnitus or ear pain.  RESPIRATORY: No cough, shortness of breath, wheezing or hemoptysis.  CARDIOVASCULAR: No chest pain, orthopnea, edema.  GASTROINTESTINAL: No nausea, vomiting, diarrhea or abdominal pain.  GENITOURINARY: No dysuria, hematuria.  ENDOCRINE: No polyuria, nocturia,  HEMATOLOGY: No anemia, easy bruising or bleeding SKIN: No rash or lesion. MUSCULOSKELETAL: No joint pain or arthritis.   NEUROLOGIC: No tingling, numbness, weakness.  PSYCHIATRY: No anxiety or depression.   Review of Systems  Unable to perform ROS: dementia  Constitutional: Negative for fever, chills and weight loss.  HENT: Negative for congestion.   Eyes: Negative for blurred vision and double vision.  Respiratory: Negative for cough, sputum production, shortness of breath and wheezing.   Cardiovascular: Negative for chest pain, palpitations, orthopnea, leg swelling and PND.  Gastrointestinal: Negative for nausea, vomiting, abdominal pain, diarrhea, constipation and blood in stool.  Genitourinary: Negative for dysuria, urgency, frequency and hematuria.  Musculoskeletal: Positive for joint pain. Negative for falls.  Neurological: Negative for dizziness, tremors, focal weakness and headaches.  Endo/Heme/Allergies: Does not bruise/bleed easily.  Psychiatric/Behavioral: Negative for depression. The patient does not have  insomnia.     DRUG ALLERGIES:  No Known Allergies  VITALS:  Blood pressure 103/40, pulse 83, temperature 98.1 F (36.7 C), temperature source Oral, resp. rate 16, height 5' (1.524 m), weight 39.917 kg (88 lb), SpO2 96 %.  PHYSICAL EXAMINATION:  GENERAL:  82 y.o.-year-old patient lying in the bed with no acute distress. She was tangled in her gown,  somewhat confused, redirected EYES: Pupils equal, round, reactive to light and accommodation. No scleral icterus. Extraocular muscles intact.  HEENT: Head atraumatic, normocephalic. Oropharynx and nasopharynx clear. Right cheek and the right side of the neck is bruised somewhat tender to palpation. No crepitations  NECK:  Supple, no jugular venous distention. No thyroid enlargement, no tenderness.  LUNGS: Normal breath sounds bilaterally, no wheezing, rales,rhonchi or crepitation. No use of accessory muscles of respiration.  CARDIOVASCULAR: S1, S2 normal. No murmurs, rubs, or gallops.  ABDOMEN: Soft, nontender, nondistended. Bowel sounds present. No organomegaly or mass.  EXTREMITIES:1+ left lower extremity and pedal edema, . No cyanosis, or clubbing. Dressing on the right lower extremity incision area at the right hip, some old blood was noted on the dressing. Upper thigh  swelling on the right .  NEUROLOGIC: Cranial nerves II through XII are intact. Muscle strength 5/5 in all extremities. Sensation intact. Gait not checked.  PSYCHIATRIC: The patient is alert and somewhat confused, however, able to answer simple questions  SKIN: No obvious rash, lesion, or ulcer.   Physical Exam LABORATORY PANEL:   CBC  Recent Labs Lab 03/04/16 0257  WBC 4.6  HGB 7.2*  HCT 21.3*  PLT 97*   ------------------------------------------------------------------------------------------------------------------  Chemistries   Recent Labs Lab 03/02/16 0409  03/04/16 0257  NA 139  < > 143  K 4.3  < >  3.7  CL 106  < > 111  CO2 28  < > 28  GLUCOSE 108*   < > 91  BUN 22*  < > 22*  CREATININE 0.82  < > 0.82  CALCIUM 8.4*  < > 7.9*  AST 31  --   --   ALT 14  --   --   ALKPHOS 62  --   --   BILITOT 1.9*  --   --   < > = values in this interval not displayed. ------------------------------------------------------------------------------------------------------------------  Cardiac Enzymes No results for input(s): TROPONINI in the last 168 hours. ------------------------------------------------------------------------------------------------------------------  RADIOLOGY:  US Venous Img Lower Unilateral Left  03/03/2016  CLINICAL DATA:  Chronic left lower extremity swelling for years. History of breast cancer. EXAM: LEFT LOWER EXTREMITY VENOUS DOPPLER ULTRASOUND TECHNIQUE: Gray-scale sonography with graded compression, as well as color Doppler and duplex ultrasound were performed to evaluate the lower extremity deep venous systems from the level of the common femoral vein and including the common femoral, femoral, profunda femoral, popliteal and calf veins including the posterior tibial, peroneal and gastrocnemius veins when visible. The superficial great saphenous vein was also interrogated. Spectral Doppler was utilized to evaluate flow at rest and with distal augmentation maneuvers in the common femoral, femoral and popliteal veins. COMPARISON:  None. FINDINGS: Contralateral Common Femoral Vein: Respiratory phasicity is normal and symmetric with the symptomatic side. No evidence of thrombus. Normal compressibility. Common Femoral Vein: No evidence of thrombus. Normal compressibility, respiratory phasicity and response to augmentation. Saphenofemoral Junction: No evidence of thrombus. Normal compressibility and flow on color Doppler imaging. Profunda Femoral Vein: No evidence of thrombus. Normal compressibility and flow on color Doppler imaging. Femoral Vein: No evidence of thrombus. Normal compressibility, respiratory phasicity and response to  augmentation. Popliteal Vein: No evidence of thrombus. Normal compressibility, respiratory phasicity and response to augmentation. Calf Veins: No evidence of thrombus. Normal compressibility and flow on color Doppler imaging. Superficial Great Saphenous Vein: No evidence of thrombus. Normal compressibility and flow on color Doppler imaging. Venous Reflux:  None. Other Findings:  None. IMPRESSION: No evidence of deep venous thrombosis. Electronically Signed   By: Marin Olp M.D.   On: 03/03/2016 09:58   Dg Hip Port Unilat With Pelvis 1v Right  03/02/2016  CLINICAL DATA:  Status post intra medullary nail placement. EXAM: DG HIP (WITH OR WITHOUT PELVIS) 1V PORT RIGHT COMPARISON:  Mar 01, 2016 FINDINGS: An intra medullary may nail has been placed across the right intertrochanteric fracture. Hardware is in good position. Alignment has significantly improved. No dislocation identified although the lateral image is limited. There appears to be an old fracture of the left greater trochanter, stable. No other acute abnormalities. IMPRESSION: Intra medullary nail placement across the right intertrochanteric fracture. Hardware is in good position. Electronically Signed   By: Dorise Bullion III M.D   On: 03/02/2016 13:10   Dg Hip Operative Unilat W Or W/o Pelvis Right  03/02/2016  CLINICAL DATA:  82 year old female with history of right-sided hip fracture. Status post ORIF. EXAM: OPERATIVE RIGHT HIP (WITH PELVIS IF PERFORMED) 4 VIEWS TECHNIQUE: Fluoroscopic spot image(s) were submitted for interpretation post-operatively. COMPARISON:  03/01/2016. FINDINGS: Four intraoperative spot views of the right hip document interval placement of a gamma nail fixation device traversing the previously noted comminuted intertrochanteric hip fracture, with restoration of normal anatomic alignment. Right hip is properly located. No acute complicating features. IMPRESSION: 1. Intraoperative documentation of ORIF for right hip fracture  with restoration of anatomic  alignment. Electronically Signed   By: Vinnie Langton M.D.   On: 03/02/2016 14:42    ASSESSMENT AND PLAN:   Active Problems:   Intertrochanteric fracture of right femur ([facility])   #Right hip intertrochanteric fracture S/p Intramedullary nail placement due to Intratrochanteric right hip fracture . Pain managementis adequate. PT evaluation is pending, likely discharged to skilled nursing facility within next few days.    #Essential hypertension, off Norvasc, now on low-dose of metoprolol due to tachycardia. No hypoxia noted. Patient's IV fluids were advanced and her heart rate improved some with IV fluid advancement, possible dehydration related. Blood pressure remains stable on metoprolol  #History of anxiety resume home medications Xanax  #Nasal bone fracture from the fall Pain management as needed  #History of breast cancer status post lumpectomy currently under remission no interventions needed at this time  # Left lower extremity swelling, no DVT on the Doppler ultrasound, supportive therapy with TED hoses. Patient is not hypoxic, although was  tachycardic, TSH is normal at 1.075  # Sinus tachycardia, resolved on metoprolol, normal TSH, likely anemia related. Since patient's hemoglobin level with rehydration dropped down to 7.2.   # Acute posthemorrhagic postoperative anemia, patient was rehydrated yesterday due to tachycardia and high hemoglobin level was noted to be low at 7.2, we will transfuse patient 1 unit of packed blood cells, follow hemoglobin level tomorrow morning  All the records are reviewed and case discussed with Care Management/Social Workerr. Management plans discussed with the patient, family and they are in agreement.  TOTAL TIME TAKING CARE OF THIS PATIENT: 30 minutes.     POSSIBLE D/C IN 2 DAYS, DEPENDING ON CLINICAL CONDITION.   Theodoro Grist M.D on 03/04/2016   Between 7am to 6pm - Pager - [phone]  After 6pm go to  [email or web address] - password [email or web address] [facility]  Office  [phone]  CC: Primary care physician; No primary care provider on file.  Note: This dictation was prepared with Dragon dictation along with smaller phrase technology. Any transcriptional errors that result from this process are unintentional.

## 2016-03-04 NOTE — Progress Notes (Signed)
PT Cancellation Note  Patient Details Name: Jasmine Stokes MRN: [id number] DOB: [birth year]   Cancelled Treatment:    Reason Eval/Treat Not Completed: Medical issues which prohibited therapy    Pt receiving blood transfusion this am.  Session held.  Will continue this pm if appropriate.   Chesley Noon 03/04/2016, 12:17 PM

## 2016-03-05 DIAGNOSIS — Z9289 Personal history of other medical treatment: Secondary | ICD-10-CM

## 2016-03-05 DIAGNOSIS — R000 Tachycardia, unspecified: Secondary | ICD-10-CM

## 2016-03-05 DIAGNOSIS — I10 Essential (primary) hypertension: Secondary | ICD-10-CM

## 2016-03-05 DIAGNOSIS — M7989 Other specified soft tissue disorders: Secondary | ICD-10-CM

## 2016-03-05 DIAGNOSIS — D62 Acute posthemorrhagic anemia: Secondary | ICD-10-CM

## 2016-03-05 DIAGNOSIS — S022XXA Fracture of nasal bones, initial encounter for closed fracture: Secondary | ICD-10-CM

## 2016-03-05 LAB — CBC
HEMATOCRIT: 26.4 % — AB (ref 35.0–47.0)
Hemoglobin: 8.9 g/dL — ABNORMAL LOW (ref 12.0–16.0)
MCH: 29.2 pg (ref 26.0–34.0)
MCHC: 33.5 g/dL (ref 32.0–36.0)
MCV: 87.1 fL (ref 80.0–100.0)
Platelets: 117 10*3/uL — ABNORMAL LOW (ref 150–440)
RBC: 3.03 MIL/uL — ABNORMAL LOW (ref 3.80–5.20)
RDW: 15.7 % — AB (ref 11.5–14.5)
WBC: 5.3 10*3/uL (ref 3.6–11.0)

## 2016-03-05 MED ORDER — ENOXAPARIN SODIUM 30 MG/0.3ML ~~LOC~~ SOLN: 30.0000 mg | SUBCUTANEOUS | Status: DC

## 2016-03-05 MED ORDER — METOPROLOL TARTRATE 25 MG PO TABS: 12.5000 mg | ORAL_TABLET | Freq: Two times a day (BID) | ORAL | Status: AC

## 2016-03-05 MED ORDER — ALPRAZOLAM 0.25 MG PO TABS: 0.2500 mg | ORAL_TABLET | Freq: Every evening | ORAL | Status: DC | PRN

## 2016-03-05 MED ORDER — ENSURE ENLIVE PO LIQD: 237.0000 mL | Freq: Two times a day (BID) | ORAL | Status: AC

## 2016-03-05 MED ORDER — POLYETHYLENE GLYCOL 3350 17 G PO PACK: 17.0000 g | PACK | Freq: Every day | ORAL | Status: AC | PRN

## 2016-03-05 MED ORDER — SENNA 8.6 MG PO TABS: 1.0000 | ORAL_TABLET | Freq: Two times a day (BID) | ORAL | Status: AC

## 2016-03-05 MED ORDER — OXYCODONE HCL 5 MG PO TABS: 5.0000 mg | ORAL_TABLET | ORAL | Status: DC | PRN

## 2016-03-05 MED ORDER — DOCUSATE SODIUM 100 MG PO CAPS: 100.0000 mg | ORAL_CAPSULE | Freq: Two times a day (BID) | ORAL | Status: AC

## 2016-03-05 MED ORDER — ALUM & MAG HYDROXIDE-SIMETH 200-200-20 MG/5ML PO SUSP: 30.0000 mL | ORAL | Status: AC | PRN

## 2016-03-05 MED ORDER — BISACODYL 10 MG RE SUPP: 10.0000 mg | Freq: Every day | RECTAL | Status: AC | PRN

## 2016-03-05 MED ORDER — FERROUS SULFATE 325 (65 FE) MG PO TABS: 325.0000 mg | ORAL_TABLET | Freq: Three times a day (TID) | ORAL | Status: DC

## 2016-03-05 NOTE — Clinical Social Work Placement (Signed)
   CLINICAL SOCIAL WORK PLACEMENT  NOTE  Date:  03/05/2016  Patient Details  Name: Jasmine Stokes MRN: [id number] Date of Birth: Oct 19, 1919  Clinical Social Work is seeking post-discharge placement for this patient at the [place] level of care (*CSW will initial, date and re-position this form in  chart as items are completed):  Yes   Patient/family provided with [place] Work Department's list of facilities offering this level of care within the geographic area requested by the patient (or if unable, by the patient's family).  Yes   Patient/family informed of their freedom to choose among providers that offer the needed level of care, that participate in Medicare, Medicaid or managed care program needed by the patient, have an available bed and are willing to accept the patient.  Yes   Patient/family informed of [facility]'s ownership interest in [facility] and [facility], as well as of the fact that they are under no obligation to receive care at these facilities.  PASRR submitted to EDS on 03/03/16     PASRR number received on 03/03/16     Existing PASRR number confirmed on       FL2 transmitted to all facilities in geographic area requested by pt/family on 03/03/16     FL2 transmitted to all facilities within larger geographic area on       Patient informed that his/her managed care company has contracts with or will negotiate with certain facilities, including the following:        Yes   Patient/family informed of bed offers received.  Patient chooses bed at  [facility] )     Physician recommends and patient chooses bed at      Patient to be transferred to  [facility] ) on 03/05/16.  Patient to be transferred to facility by  [facility] )     Patient family notified on 03/05/16 of transfer.  Name of family member notified:   ([place] left patient's nieice Beverly a voicemail making her aware of D/C today.   )     PHYSICIAN       Additional Comment:    _______________________________________________ Loralyn Freshwater, LCSW 03/05/2016, 1:06 PM

## 2016-03-05 NOTE — Progress Notes (Signed)
Patient is medically stable for D/C to [facility] today. Per Neoma Laming admissions coordinator at [facility] patient will go to room 322. RN will call report to [facility] and arrange EMS for transport. Clinical Education officer, museum (CSW) sent D/C Summary, FL2 and D/C Packet to [employer] via [employer]. Patient is aware of above. CSW left patient's niece Rise Paganini a voicemail making her aware of above. Please reconsult if future social work needs arise. CSW signing off.   Blima Rich, LCSW [phone]

## 2016-03-05 NOTE — Progress Notes (Signed)
EMS here to take pt.

## 2016-03-05 NOTE — Discharge Summary (Addendum)
[place] at [place] NAME: Jasmine Stokes    MR#:  [id number]  DATE OF BIRTH:  1919/01/08  DATE OF ADMISSION:  03/01/2016 ADMITTING PHYSICIAN: Nicholes Mango, MD  DATE OF DISCHARGE: No discharge date for patient encounter.  PRIMARY CARE PHYSICIAN: No primary care provider on file.     ADMISSION DIAGNOSIS:  Fall, initial encounter [W19.XXXA] Closed right hip fracture, initial encounter ([place]) [S72.001A] Nasal bone fracture, closed, initial encounter [S02.2XXA]  DISCHARGE DIAGNOSIS:  Principal Problem:   Intertrochanteric fracture of right femur (HCC) Active Problems:   Acute posthemorrhagic anemia   History of blood transfusion   Closed fracture of nasal bone   Leg swelling   Sinus tachycardia (HCC)   Swelling of lower extremity   Essential hypertension   SECONDARY DIAGNOSIS:   Past Medical History  Diagnosis Date  . Hypertension   . Cancer ([place])     .pro HOSPITAL COURSE:   Patient is 82 year old Caucasian female with past medical history significant for history of essential hypertension, breast cancer, breast lumpectomy, eye surgery, who presents to the hospital with complaints of fall. Patient apparently tripped in the morning and fell down on the right side, hitting her head, no loss of consciousness reported, patient was unable to stand due to right hip pain. Patient was bleeding from both nostrils and they're also had bruising on the right side of the face. On arrival to emergency room, she was noted to have comminuted right intratrochanteric fracture of the hip, CT scan of maxillofacial area revealed minimally depressed fractures of right nasal bone in the anterior wall of the right maxillary sinus, soft tissue contusion. Patient was admitted to the hospital for further evaluation and treatment. She was seen by orthopedist surgeon, Dr. Mack Guise, and underwent intramedullary nail placement for right intertrochanteric  fracture on 03/02/2016. Postoperatively, she was noted to be anemic and was transfused 1 unit of packed red blood cells. Her hemoglobin level improved from 7.2-8.9. By the day of discharge. She was seen by physical therapist and recommended skilled nursing facility placement for rehabilitation. Discussion by problem:  #Right hip intertrochanteric fracture S/p Intramedullary nail placement due to Intratrochanteric right hip fracture . Pain managementis is adequate. PT recommended discharge to skilled nursing facility for rehabilitation, will be discharged today. The patient is allowed partial weightbearing until she is seen by orthopedist surgeon, Dr. Mack Guise in about 2 weeks after discharge. Patient is to continue TED stockings and Lovenox for DVTprophylaxis.   #Essential hypertension, off Norvasc, now on low-dose of metoprolol due to tachycardia. Blood pressure remains stable on metoprolol.   #History of anxiety resume home Xanax  #Nasal bone fracture from the fall Pain management as needed, ENT follow-up as outpatient  #History of breast cancer status post lumpectomy currently under remission no interventions needed at this time, continue follow up as previously scheduled  # Left lower extremity swelling, no DVT on the Doppler ultrasound, supportive therapy with TED hoses. Patient is not hypoxic, although was tachycardic, TSH is normal at 1.075  # Sinus tachycardia, resolved on metoprolol, normal TSH, likely anemia related. Since patient's hemoglobin level with rehydration dropped down to 7.2.   # Acute posthemorrhagic postoperative anemia, status post 1 unit of packed blood cell transfusion, follow hemoglobin level as outpatient, continue iron supplementation orally  DISCHARGE CONDITIONS:   Stable  CONSULTS OBTAINED:     DRUG ALLERGIES:  No Known Allergies  DISCHARGE MEDICATIONS:   Current Discharge Medication List  START taking these medications   Details  alum & mag  hydroxide-simeth (MAALOX/MYLANTA) 200-200-20 MG/5ML suspension Take 30 mLs by mouth every 4 (four) hours as needed for indigestion. Qty: 355 mL, Refills: 0    bisacodyl (DULCOLAX) 10 MG suppository Place 1 suppository (10 mg total) rectally daily as needed for moderate constipation. Qty: 12 suppository, Refills: 0    docusate sodium (COLACE) 100 MG capsule Take 1 capsule (100 mg total) by mouth 2 (two) times daily. Qty: 10 capsule, Refills: 0    enoxaparin (LOVENOX) 30 MG/0.3ML injection Inject 0.3 mLs (30 mg total) into the skin daily. Qty: 14 Syringe, Refills: 0    feeding supplement, ENSURE ENLIVE, (ENSURE ENLIVE) LIQD Take 237 mLs by mouth 2 (two) times daily between meals. Qty: 237 mL, Refills: 12    ferrous sulfate 325 (65 FE) MG tablet Take 1 tablet (325 mg total) by mouth 3 (three) times daily after meals. Qty: 90 tablet, Refills: 3    metoprolol tartrate (LOPRESSOR) 25 MG tablet Take 0.5 tablets (12.5 mg total) by mouth 2 (two) times daily. Qty: 60 tablet, Refills: 5    oxyCODONE (OXY IR/ROXICODONE) 5 MG immediate release tablet Take 1-2 tablets (5-10 mg total) by mouth every 4 (four) hours as needed for breakthrough pain ((for MODERATE breakthrough pain)). Qty: 30 tablet, Refills: 0    polyethylene glycol (MIRALAX / GLYCOLAX) packet Take 17 g by mouth daily as needed for mild constipation. Qty: 14 each, Refills: 0    senna (SENOKOT) 8.6 MG TABS tablet Take 1 tablet (8.6 mg total) by mouth 2 (two) times daily. Qty: 120 each, Refills: 0      CONTINUE these medications which have NOT CHANGED   Details  ALPRAZolam (XANAX) 0.25 MG tablet Take 1-2 tablets by mouth at bedtime as needed.    anastrozole (ARIMIDEX) 1 MG tablet Take 1 tablet by mouth daily. Reported on 03/01/2016    ibuprofen (ADVIL,MOTRIN) 200 MG tablet Take 1 tablet by mouth every 6 (six) hours as needed. Reported on 03/01/2016    Vitamin D, Ergocalciferol, (DRISDOL) 50000 units CAPS capsule Take 1 capsule by  mouth once a week. Reported on 03/01/2016      STOP taking these medications     amLODipine (NORVASC) 2.5 MG tablet          DISCHARGE INSTRUCTIONS:    Patient is to follow-up with primary care physician, ENT, orthopedist surgeon as outpatient  If you experience worsening of your admission symptoms, develop shortness of breath, life threatening emergency, suicidal or homicidal thoughts you must seek medical attention immediately by calling 911 or calling your MD immediately  if symptoms less severe.  You Must read complete instructions/literature along with all the possible adverse reactions/side effects for all the Medicines you take and that have been prescribed to you. Take any new Medicines after you have completely understood and accept all the possible adverse reactions/side effects.   Please note  You were cared for by a hospitalist during your hospital stay. If you have any questions about your discharge medications or the care you received while you were in the hospital after you are discharged, you can call the unit and asked to speak with the hospitalist on call if the hospitalist that took care of you is not available. Once you are discharged, your primary care physician will handle any further medical issues. Please note that NO REFILLS for any discharge medications will be authorized once you are discharged, as it is imperative that you  return to your primary care physician (or establish a relationship with a primary care physician if you do not have one) for your aftercare needs so that they can reassess your need for medications and monitor your lab values.    Today   CHIEF COMPLAINT:   Chief Complaint  Patient presents with  . Fall    HISTORY OF PRESENT ILLNESS:  Jasmine Stokes  is a 82 y.o. female with a known history of essential hypertension, breast cancer, breast lumpectomy, eye surgery, who presents to the hospital with complaints of fall. Patient apparently  tripped in the morning and fell down on the right side, hitting her head, no loss of consciousness reported, patient was unable to stand due to right hip pain. Patient was bleeding from both nostrils and they're also had bruising on the right side of the face. On arrival to emergency room, she was noted to have comminuted right intratrochanteric fracture of the hip, CT scan of maxillofacial area revealed minimally depressed fractures of right nasal bone in the anterior wall of the right maxillary sinus, soft tissue contusion. Patient was admitted to the hospital for further evaluation and treatment. She was seen by orthopedist surgeon, Dr. Mack Guise, and underwent intramedullary nail placement for right intertrochanteric fracture on 03/02/2016. Postoperatively, she was noted to be anemic and was transfused 1 unit of packed red blood cells. Her hemoglobin level improved from 7.2-8.9. By the day of discharge. She was seen by physical therapist and recommended skilled nursing facility placement for rehabilitation. Discussion by problem:  #Right hip intertrochanteric fracture S/p Intramedullary nail placement due to Intratrochanteric right hip fracture . Pain managementis is adequate. PT recommended discharge to skilled nursing facility for rehabilitation, will be discharged today. The patient is allowed partial weightbearing until she is seen by orthopedist surgeon, Dr. Mack Guise in about 2 weeks after discharge. Patient is to continue TED stockings and Lovenox for DVTprophylaxis.    #Essential hypertension, off Norvasc, now on low-dose of metoprolol due to tachycardia. Blood pressure remains stable on metoprolol.   #History of anxiety resume home Xanax  #Nasal bone fracture from the fall Pain management as needed, ENT follow-up as outpatient  #History of breast cancer status post lumpectomy currently under remission no interventions needed at this time, continue follow up as previously scheduled  #  Left lower extremity swelling, no DVT on the Doppler ultrasound, supportive therapy with TED hoses. Patient is not hypoxic, although was tachycardic, TSH is normal at 1.075  # Sinus tachycardia, resolved on metoprolol, normal TSH, likely anemia related. Since patient's hemoglobin level with rehydration dropped down to 7.2.   # Acute posthemorrhagic postoperative anemia, status post 1 unit of packed blood cell transfusion, follow hemoglobin level as outpatient, continue iron supplementation orally    VITAL SIGNS:  Blood pressure 133/54, pulse 81, temperature 98.1 F (36.7 C), temperature source Oral, resp. rate 18, height 5' (1.524 m), weight 39.917 kg (88 lb), SpO2 94 %.  I/O:   Intake/Output Summary (Last 24 hours) at 03/05/16 0856 Last data filed at 03/05/16 0601  Gross per 24 hour  Intake    790 ml  Output    300 ml  Net    490 ml    PHYSICAL EXAMINATION:  GENERAL:  82 y.o.-year-old patient lying in the bed with no acute distress.  EYES: Pupils equal, round, reactive to light and accommodation. No scleral icterus. Extraocular muscles intact.  HEENT: Head atraumatic, normocephalic. Oropharynx and nasopharynx clear.  NECK:  Supple, no jugular venous  distention. No thyroid enlargement, no tenderness.  LUNGS: Normal breath sounds bilaterally, no wheezing, rales,rhonchi or crepitation. No use of accessory muscles of respiration.  CARDIOVASCULAR: S1, S2 normal. No murmurs, rubs, or gallops.  ABDOMEN: Soft, non-tender, non-distended. Bowel sounds present. No organomegaly or mass.  EXTREMITIES: No pedal edema, cyanosis, or clubbing.  NEUROLOGIC: Cranial nerves II through XII are intact. Muscle strength 5/5 in all extremities. Sensation intact. Gait not checked.  PSYCHIATRIC: The patient is alert and oriented x 3.  SKIN: No obvious rash, lesion, or ulcer.   DATA REVIEW:   CBC  Recent Labs Lab 03/05/16 0322  WBC 5.3  HGB 8.9*  HCT 26.4*  PLT 117*    Chemistries   Recent  Labs Lab 03/02/16 0409  03/04/16 0257  NA 139  < > 143  K 4.3  < > 3.7  CL 106  < > 111  CO2 28  < > 28  GLUCOSE 108*  < > 91  BUN 22*  < > 22*  CREATININE 0.82  < > 0.82  CALCIUM 8.4*  < > 7.9*  AST 31  --   --   ALT 14  --   --   ALKPHOS 62  --   --   BILITOT 1.9*  --   --   < > = values in this interval not displayed.  Cardiac Enzymes No results for input(s): TROPONINI in the last 168 hours.  Microbiology Results  Results for orders placed or performed during the hospital encounter of 03/01/16  Surgical pcr screen     Status: None   Collection Time: 03/01/16  2:42 PM  Result Value Ref Range Status   MRSA, PCR NEGATIVE NEGATIVE Final   Staphylococcus aureus NEGATIVE NEGATIVE Final    Comment:        The Xpert SA Assay (FDA approved for NASAL specimens in patients over 52 years of age), is one component of a comprehensive surveillance program.  Test performance has been validated by [facility] for patients greater than or equal to 88 year old. It is not intended to diagnose infection nor to guide or monitor treatment.     RADIOLOGY:  US Venous Img Lower Unilateral Left  03/03/2016  CLINICAL DATA:  Chronic left lower extremity swelling for years. History of breast cancer. EXAM: LEFT LOWER EXTREMITY VENOUS DOPPLER ULTRASOUND TECHNIQUE: Gray-scale sonography with graded compression, as well as color Doppler and duplex ultrasound were performed to evaluate the lower extremity deep venous systems from the level of the common femoral vein and including the common femoral, femoral, profunda femoral, popliteal and calf veins including the posterior tibial, peroneal and gastrocnemius veins when visible. The superficial great saphenous vein was also interrogated. Spectral Doppler was utilized to evaluate flow at rest and with distal augmentation maneuvers in the common femoral, femoral and popliteal veins. COMPARISON:  None. FINDINGS: Contralateral Common Femoral Vein:  Respiratory phasicity is normal and symmetric with the symptomatic side. No evidence of thrombus. Normal compressibility. Common Femoral Vein: No evidence of thrombus. Normal compressibility, respiratory phasicity and response to augmentation. Saphenofemoral Junction: No evidence of thrombus. Normal compressibility and flow on color Doppler imaging. Profunda Femoral Vein: No evidence of thrombus. Normal compressibility and flow on color Doppler imaging. Femoral Vein: No evidence of thrombus. Normal compressibility, respiratory phasicity and response to augmentation. Popliteal Vein: No evidence of thrombus. Normal compressibility, respiratory phasicity and response to augmentation. Calf Veins: No evidence of thrombus. Normal compressibility and flow on color Doppler imaging. Superficial  Great Saphenous Vein: No evidence of thrombus. Normal compressibility and flow on color Doppler imaging. Venous Reflux:  None. Other Findings:  None. IMPRESSION: No evidence of deep venous thrombosis. Electronically Signed   By: Marin Olp M.D.   On: 03/03/2016 09:58    EKG:   Orders placed or performed during the hospital encounter of 03/01/16  . ED EKG  . ED EKG      Management plans discussed with the patient, family and they are in agreement.  CODE STATUS:  Code Status History    Date Active Date Inactive Code Status Order ID Comments User Context   03/01/2016  2:26 PM 03/02/2016  1:30 PM Full Code [id number]  Nicholes Mango, MD Inpatient   03/01/2016 12:42 PM 03/01/2016  2:26 PM Full Code [id number]  Thornton Park, MD ED      TOTAL TIME TAKING CARE OF THIS PATIENT: 40 minutes.    Theodoro Grist M.D on 03/05/2016 at 8:56 AM  Between 7am to 6pm - Pager - [phone]  After 6pm go to [email or web address] - password [email or web address] [facility]  [facility]  Office  [phone]  CC: Primary care physician; No primary care provider on file.

## 2016-03-05 NOTE — Care Management Important Message (Signed)
Important Message  Patient Details  Name: Jasmine Stokes MRN: [id number] Date of Birth: [birth year]   Medicare Important Message Given:  Yes    [name] 03/05/2016, 10:17 AM

## 2016-03-05 NOTE — Progress Notes (Signed)
DISCHARGE NOTE:  Report called to [place] at [facility].  EMS called for pick up.

## 2016-03-05 NOTE — Consult Note (Signed)
ANTICOAGULATION CONSULT NOTE - Follow Up Consult  Pharmacy Consult for enoxaparin Indication: VTE prophylaxis  No Known Allergies  Patient Measurements: Height: 5' (152.4 cm) Weight: 88 lb (39.917 kg) IBW/kg (Calculated) : 45.5   Vital Signs: Temp: 98.1 F (36.7 C) (05/16 0715) Temp Source: Oral (05/16 0715) BP: 133/54 mmHg (05/16 0715) Pulse Rate: 81 (05/16 0715)  Labs:  Recent Labs  03/03/16 0327  03/04/16 0257 03/04/16 1423 03/05/16 0322  HGB 8.8*  < > 7.2* 8.5* 8.9*  HCT 26.4*  --  21.3* 25.5* 26.4*  PLT 115*  --  97*  --  117*  CREATININE 0.92  --  0.82  --   --   < > = values in this interval not displayed.  Estimated Creatinine Clearance: 25.8 mL/min (by C-G formula based on Cr of 0.82).   Medical History: Past Medical History  Diagnosis Date  . Hypertension   . Cancer ([place])     Medications:  Scheduled:  . sodium chloride   Intravenous Once  . acetaminophen  650 mg Oral Once  . docusate sodium  100 mg Oral BID  . enoxaparin (LOVENOX) injection  30 mg Subcutaneous Q24H  . feeding supplement (ENSURE ENLIVE)  237 mL Oral BID BM  . ferrous sulfate  325 mg Oral TID PC  . metoprolol tartrate  12.5 mg Oral BID  . senna  1 tablet Oral BID    Assessment: Pt is s/p INTRAMEDULLARY (IM) NAIL INTERTROCHANTRIC  Goal of Therapy:   Monitor platelets by anticoagulation protocol: Yes   Plan:  Continue Lovenox 30 mq subq q24h based on est CrCl<30 mL/min.  Murrell Converse, PharmD Clinical Pharmacist 03/05/2016

## 2016-03-05 NOTE — Evaluation (Signed)
Occupational Therapy Evaluation Patient Details Name: YANEXI DOUGAL MRN: [id number] DOB: April 03, 1919 Today's Date: 03/05/2016    History of Present Illness 82yo white female sustained a fall at home, whence rollator was to have been locked, howevere to her surprise was not. She then crawled to th eback door to call for help. she wa sfoudn to have sustained a R hip fracture, along with some trauma to her face, RUE, and other areas. She presents today on POD1. PT eval was inititated earlier todya, but held by hospitalist to r/o DVT., which has since been done. At baseline, pt lives alone, indep in ADL, household AMB modI with rollator, and limited community distances with rollator. She still makes groceries with daughter, but says it wears her out. She still performs all of her housework.    Clinical Impression   Pt. Is a 82 y.o. Female  who was admitted to [facility] for surgical intervention following a right Fracture s/p IM nailing. Pt. presents with pain, limited ROM, weakness, and limited activity tolerance which hinder her ability to complete ADL tasks. Pt. could benefit from skilled OT services to review adaptive equipment training for LE ADLs, review work simplification techniques, and to improve ADL functioning and work towards returning to her PLOF.    Follow Up Recommendations  SNF    Equipment Recommendations       Recommendations for Other Services PT consult     Precautions / Restrictions Precautions Precautions: Fall Restrictions Weight Bearing Restrictions: Yes RLE Weight Bearing: Partial weight bearing      Mobility Bed Mobility Overal bed mobility: Needs Assistance Bed Mobility: Supine to Sit     Supine to sit: Mod assist     General bed mobility comments: Cues required for each step of the task.  Transfers Overall transfer level: Needs assistance Equipment used: Rolling walker (2 wheeled) Transfers: Stand Pivot Transfers;Sit to/from Stand Sit to Stand: Min  assist Stand pivot transfers: Min assist       General transfer comment: Consistent verbal cues were required. Cues for hand placement when pushing up into standing.    Balance Overall balance assessment: Needs assistance   Sitting balance-Leahy Scale: Good       Standing balance-Leahy Scale: Fair                              ADL Overall ADL's : Needs assistance/impaired                                       General ADL Comments: Pt. requires ModA LE ADL tasks, with verbal cues, tactile cues and visual demonstration.     Vision     Perception     Praxis      Pertinent Vitals/Pain       Hand Dominance Right   Extremity/Trunk Assessment Upper Extremity Assessment Upper Extremity Assessment: Generalized weakness           Communication Communication Communication: No difficulties;HOH   Cognition Arousal/Alertness: Awake/alert Behavior During Therapy: WFL for tasks assessed/performed Overall Cognitive Status: Within Functional Limits for tasks assessed                     General Comments   OT Treat: Pt. Education was provided, and pt. worked on LE dressing with A/E use with modA and verbal cues.    Exercises  Shoulder Instructions      Home Living Family/patient expects to be discharged to:: Skilled nursing facility Living Arrangements: Alone                                      Prior Functioning/Environment Level of Independence: Independent with assistive device(s)             OT Diagnosis: Generalized weakness;Acute pain   OT Problem List: Decreased strength;Pain;Decreased activity tolerance;Decreased knowledge of use of DME or AE   OT Treatment/Interventions: Self-care/ADL training;Therapeutic activities;Therapeutic exercise;Patient/family education;DME and/or AE instruction    OT Goals(Current goals can be found in the care plan section) Acute Rehab OT Goals Patient Stated  Goal: to return home. OT Goal Formulation: With patient Time For Goal Achievement: 03/19/16 Potential to Achieve Goals: Good  OT Frequency: Min 2X/week   Barriers to D/C:            Co-evaluation              End of Session Equipment Utilized During Treatment: Gait belt  Activity Tolerance: Patient tolerated treatment well Patient left: in chair;with call bell/phone within reach;with chair alarm set   Time: 1010-1035 OT Time Calculation (min): 25 min Charges:  OT General Charges $OT Visit: 1 Procedure OT Evaluation $OT Eval Moderate Complexity: 1 Procedure OT Treatments $Self Care/Home Management : 8-22 mins G-Codes:    Harrel Carina, MS, OTR/L Harrel Carina 03/05/2016, 10:44 AM

## 2016-03-05 NOTE — Progress Notes (Addendum)
  Subjective:  POD #3  status post nodular fixation for right intertrochanteric hip fracture. Patient reports pain as mild.  She denies shortness of breath, chest pain or abdominal pain. She has had a bowel movement.  Objective:   VITALS:   Filed Vitals:   03/04/16 1618 03/04/16 1947 03/05/16 0404 03/05/16 0715  BP: 122/58 137/53 129/70 133/54  Pulse: 85 90 99 81  Temp: 97.9 F (36.6 C) 97 F (36.1 C) 99.9 F (37.7 C) 98.1 F (36.7 C)  TempSrc: Oral Oral Oral Oral  Resp: 16 17 19 18   Height:      Weight:      SpO2:  96% 94% 94%    PHYSICAL EXAM:  Right lower extremity: Patient is sitting upright in her bed. Her dressing has minimal spotting. Her incision is clean dry and intact with no active bleeding or drainage. Her thigh is swollen but her compartments are soft and compressible. She has ecchymosis in the right thigh which is stable She has intact sensation to light touch in palpable pedal pulses. She has intact motor function in the bilateral lower extremity.  LABS  Results for orders placed or performed during the hospital encounter of 03/01/16 (from the past 24 hour(s))  Hemoglobin and hematocrit, blood     Status: Abnormal   Collection Time: 03/04/16  2:23 PM  Result Value Ref Range   Hemoglobin 8.5 (L) 12.0 - 16.0 g/dL   HCT 25.5 (L) 35.0 - 47.0 %  CBC     Status: Abnormal   Collection Time: 03/05/16  3:22 AM  Result Value Ref Range   WBC 5.3 3.6 - 11.0 K/uL   RBC 3.03 (L) 3.80 - 5.20 MIL/uL   Hemoglobin 8.9 (L) 12.0 - 16.0 g/dL   HCT 26.4 (L) 35.0 - 47.0 %   MCV 87.1 80.0 - 100.0 fL   MCH 29.2 26.0 - 34.0 pg   MCHC 33.5 32.0 - 36.0 g/dL   RDW 15.7 (H) 11.5 - 14.5 %   Platelets 117 (L) 150 - 440 K/uL    No results found.  Assessment/Plan: 3 Days Post-Op   Principal Problem:   Intertrochanteric fracture of right femur (HCC) Active Problems:   Essential hypertension   Closed fracture of nasal bone   Leg swelling   Sinus tachycardia (HCC)   Acute  posthemorrhagic anemia   History of blood transfusion   Swelling of lower extremity   Patient is going to skilled nursing today. Her hemoglobin and hematocrit improved today after a single unit transfusion. She'll follow-up with me in 10-14 days in my office. She will be discharged on 2 weeks of Lovenox.  Thornton Park , MD 03/05/2016, 1:14 PM

## 2016-03-05 NOTE — Progress Notes (Signed)
Spoke with Lyndee Leo, [facility] rep at [phone], to notify of non-emergent EMS transport.  Auth notification reference given as [id number].   Service date range good from 03/05/16 - 06/03/16.   Gap exception requested to determine if services can be considered at an in-network level.

## 2016-03-05 NOTE — Progress Notes (Signed)
Physical Therapy Treatment Patient Details Name: Jasmine Stokes MRN: [id number] DOB: 13-Oct-1919 Today's Date: 03/05/2016    History of Present Illness 82yo white female sustained a fall at home, whence rollator was to have been locked, howevere to her surprise was not. She then crawled to th eback door to call for help. she wa sfoudn to have sustained a R hip fracture, along with some trauma to her face, RUE, and other areas. She presents today on POD1. PT eval was inititated earlier todya, but held by hospitalist to r/o DVT., which has since been done. At baseline, pt lives alone, indep in ADL, household AMB modI with rollator, and limited community distances with rollator. She still makes groceries with daughter, but says it wears her out. She still performs all of her housework.     PT Comments    Pt in recliner at bedside.  Pt reports being fatigued from morning care - had transferred in/oob to/from commode with nursing with inc BM, had recently finished with OT where she transferred out of bed to recliner.  Due to frequent transfers this morning, pt participated in RLE exercises as described below.  She is scheduled to transfer to rehab this afternoon.   Follow Up Recommendations  SNF     Equipment Recommendations  None recommended by PT    Recommendations for Other Services       Precautions / Restrictions Precautions Precautions: Fall Restrictions Weight Bearing Restrictions: Yes RLE Weight Bearing: Partial weight bearing    Mobility  Bed Mobility Overal bed mobility: Needs Assistance Bed Mobility: Supine to Sit     Supine to sit: Mod assist     General bed mobility comments: Cues required for each step of the task.  Transfers Overall transfer level: Needs assistance Equipment used: Rolling walker (2 wheeled) Transfers: Stand Pivot Transfers;Sit to/from Stand Sit to Stand: Min assist Stand pivot transfers: Min assist       General transfer comment: Consistent  verbal cues were required. Cues for hand placement when pushing up into standing.  Ambulation/Gait                 Stairs            Wheelchair Mobility    Modified Rankin (Stroke Patients Only)       Balance Overall balance assessment: Needs assistance   Sitting balance-Leahy Scale: Good       Standing balance-Leahy Scale: Fair                      Cognition Arousal/Alertness: Awake/alert Behavior During Therapy: WFL for tasks assessed/performed Overall Cognitive Status: Within Functional Limits for tasks assessed                      Exercises General Exercises - Lower Extremity Ankle Circles/Pumps: AROM;Both;20 reps Quad Sets: AROM;Both;10 reps Gluteal Sets: AROM;Both;10 reps Heel Slides: AAROM;Right;20 reps;Supine Hip ABduction/ADduction: AAROM;Right;20 reps;Supine    General Comments        Pertinent Vitals/Pain Pain Assessment: 0-10 Pain Score: 4  Pain Location: R hip Pain Descriptors / Indicators: Aching;Sore Pain Intervention(s): Limited activity within patient's tolerance    Home Living Family/patient expects to be discharged to:: Skilled nursing facility Living Arrangements: Alone                  Prior Function Level of Independence: Independent with assistive device(s)          PT Goals (current goals can  now be found in the care plan section) Acute Rehab PT Goals Patient Stated Goal: to return home. Progress towards PT goals: Progressing toward goals    Frequency  BID    PT Plan Current plan remains appropriate    Co-evaluation             End of Session   Activity Tolerance: Patient tolerated treatment well;Patient limited by fatigue       Time: 1025-1035 PT Time Calculation (min) (ACUTE ONLY): 10 min  Charges:  $Gait Training: 8-22 mins                    G Codes:      Chesley Noon, PTA 03/05/2016, 12:15 PM

## 2016-03-07 LAB — TYPE AND SCREEN
ABO/RH(D): O POS
ANTIBODY SCREEN: NEGATIVE
UNIT DIVISION: 0

## 2017-07-06 ENCOUNTER — Emergency Department: Payer: Medicare Other

## 2017-07-06 ENCOUNTER — Encounter: Payer: Self-pay | Admitting: Emergency Medicine

## 2017-07-06 ENCOUNTER — Inpatient Hospital Stay: Payer: Medicare Other

## 2017-07-06 ENCOUNTER — Inpatient Hospital Stay
Admission: EM | Admit: 2017-07-06 | Discharge: 2017-07-07 | DRG: 392 | Disposition: A | Discharge status: Skilled Nursing Facility | Payer: Medicare Other | Attending: Internal Medicine | Admitting: Internal Medicine

## 2017-07-06 DIAGNOSIS — Z23 Encounter for immunization: Secondary | ICD-10-CM | POA: Diagnosis not present

## 2017-07-06 DIAGNOSIS — K8020 Calculus of gallbladder without cholecystitis without obstruction: Secondary | ICD-10-CM | POA: Diagnosis present

## 2017-07-06 DIAGNOSIS — R1011 Right upper quadrant pain: Secondary | ICD-10-CM

## 2017-07-06 DIAGNOSIS — I2510 Atherosclerotic heart disease of native coronary artery without angina pectoris: Secondary | ICD-10-CM | POA: Diagnosis present

## 2017-07-06 DIAGNOSIS — Z8249 Family history of ischemic heart disease and other diseases of the circulatory system: Secondary | ICD-10-CM

## 2017-07-06 DIAGNOSIS — W1830XA Fall on same level, unspecified, initial encounter: Secondary | ICD-10-CM | POA: Diagnosis present

## 2017-07-06 DIAGNOSIS — Z66 Do not resuscitate: Secondary | ICD-10-CM | POA: Diagnosis present

## 2017-07-06 DIAGNOSIS — Z79899 Other long term (current) drug therapy: Secondary | ICD-10-CM | POA: Diagnosis not present

## 2017-07-06 DIAGNOSIS — R109 Unspecified abdominal pain: Secondary | ICD-10-CM | POA: Diagnosis present

## 2017-07-06 DIAGNOSIS — L8990 Pressure ulcer of unspecified site, unspecified stage: Secondary | ICD-10-CM | POA: Insufficient documentation

## 2017-07-06 DIAGNOSIS — L03116 Cellulitis of left lower limb: Secondary | ICD-10-CM | POA: Diagnosis present

## 2017-07-06 DIAGNOSIS — E876 Hypokalemia: Secondary | ICD-10-CM | POA: Diagnosis not present

## 2017-07-06 DIAGNOSIS — I10 Essential (primary) hypertension: Secondary | ICD-10-CM | POA: Diagnosis not present

## 2017-07-06 DIAGNOSIS — R609 Edema, unspecified: Secondary | ICD-10-CM

## 2017-07-06 DIAGNOSIS — K5900 Constipation, unspecified: Principal | ICD-10-CM | POA: Diagnosis present

## 2017-07-06 DIAGNOSIS — Z79811 Long term (current) use of aromatase inhibitors: Secondary | ICD-10-CM | POA: Diagnosis not present

## 2017-07-06 DIAGNOSIS — Z853 Personal history of malignant neoplasm of breast: Secondary | ICD-10-CM

## 2017-07-06 DIAGNOSIS — M869 Osteomyelitis, unspecified: Secondary | ICD-10-CM

## 2017-07-06 LAB — LIPASE, BLOOD: LIPASE: 24 U/L (ref 11–51)

## 2017-07-06 LAB — URINALYSIS, COMPLETE (UACMP) WITH MICROSCOPIC
Bilirubin Urine: NEGATIVE
Glucose, UA: NEGATIVE mg/dL
HGB URINE DIPSTICK: NEGATIVE
Ketones, ur: 5 mg/dL — AB
Leukocytes, UA: NEGATIVE
Nitrite: NEGATIVE
Protein, ur: NEGATIVE mg/dL
RBC / HPF: NONE SEEN RBC/hpf (ref 0–5)
SPECIFIC GRAVITY, URINE: 1.012 (ref 1.005–1.030)
pH: 6 (ref 5.0–8.0)

## 2017-07-06 LAB — COMPREHENSIVE METABOLIC PANEL
ALBUMIN: 4 g/dL (ref 3.5–5.0)
ALT: 26 U/L (ref 14–54)
AST: 37 U/L (ref 15–41)
Alkaline Phosphatase: 109 U/L (ref 38–126)
Anion gap: 11 (ref 5–15)
BILIRUBIN TOTAL: 1.5 mg/dL — AB (ref 0.3–1.2)
BUN: 29 mg/dL — AB (ref 6–20)
CHLORIDE: 101 mmol/L (ref 101–111)
CO2: 27 mmol/L (ref 22–32)
CREATININE: 0.99 mg/dL (ref 0.44–1.00)
Calcium: 9.3 mg/dL (ref 8.9–10.3)
GFR calc Af Amer: 54 mL/min — ABNORMAL LOW (ref >60)
GFR, EST NON AFRICAN AMERICAN: 47 mL/min — AB (ref >60)
GLUCOSE: 111 mg/dL — AB (ref 65–99)
Potassium: 3.2 mmol/L — ABNORMAL LOW (ref 3.5–5.1)
SODIUM: 139 mmol/L (ref 135–145)
TOTAL PROTEIN: 6.9 g/dL (ref 6.5–8.1)

## 2017-07-06 LAB — LACTIC ACID, PLASMA: LACTIC ACID, VENOUS: 1.1 mmol/L (ref 0.5–1.9)

## 2017-07-06 LAB — CBC WITH DIFFERENTIAL/PLATELET
BASOS ABS: 0.1 10*3/uL (ref 0–0.1)
Basophils Relative: 1 %
EOS PCT: 2 %
Eosinophils Absolute: 0.1 10*3/uL (ref 0–0.7)
HCT: 37.7 % (ref 35.0–47.0)
Hemoglobin: 12.9 g/dL (ref 12.0–16.0)
LYMPHS ABS: 1 10*3/uL (ref 1.0–3.6)
LYMPHS PCT: 15 %
MCH: 28.7 pg (ref 26.0–34.0)
MCHC: 34.2 g/dL (ref 32.0–36.0)
MCV: 83.9 fL (ref 80.0–100.0)
Monocytes Absolute: 0.5 10*3/uL (ref 0.2–0.9)
Monocytes Relative: 8 %
NEUTROS ABS: 4.8 10*3/uL (ref 1.4–6.5)
Neutrophils Relative %: 74 %
PLATELETS: 238 10*3/uL (ref 150–440)
RBC: 4.5 MIL/uL (ref 3.80–5.20)
RDW: 13.6 % (ref 11.5–14.5)
WBC: 6.5 10*3/uL (ref 3.6–11.0)

## 2017-07-06 LAB — MAGNESIUM: Magnesium: 1.9 mg/dL (ref 1.7–2.4)

## 2017-07-06 MED ORDER — IOPAMIDOL (ISOVUE-300) INJECTION 61%
60.0000 mL | Freq: Once | INTRAVENOUS | Status: AC | PRN
  Administered 2017-07-06: 60 mL via INTRAVENOUS

## 2017-07-06 MED ORDER — ACETAMINOPHEN 650 MG RE SUPP: 650.0000 mg | Freq: Four times a day (QID) | RECTAL | Status: DC | PRN

## 2017-07-06 MED ORDER — FENTANYL CITRATE (PF) 100 MCG/2ML IJ SOLN
INTRAMUSCULAR | Status: AC
  Administered 2017-07-06: 50 ug via INTRAVENOUS
  Filled 2017-07-06: qty 2

## 2017-07-06 MED ORDER — INFLUENZA VAC SPLIT HIGH-DOSE 0.5 ML IM SUSY
0.5000 mL | PREFILLED_SYRINGE | INTRAMUSCULAR | Status: DC
  Filled 2017-07-06 (×2): qty 0.5

## 2017-07-06 MED ORDER — BISACODYL 10 MG RE SUPP
10.0000 mg | Freq: Once | RECTAL | Status: AC
  Administered 2017-07-06: 10 mg via RECTAL
  Filled 2017-07-06: qty 1

## 2017-07-06 MED ORDER — POLYETHYLENE GLYCOL 3350 17 G PO PACK: 17.0000 g | PACK | Freq: Every day | ORAL | Status: DC | PRN

## 2017-07-06 MED ORDER — ENOXAPARIN SODIUM 40 MG/0.4ML ~~LOC~~ SOLN: 40.0000 mg | SUBCUTANEOUS | Status: DC

## 2017-07-06 MED ORDER — POTASSIUM CHLORIDE 10 MEQ/100ML IV SOLN
10.0000 meq | INTRAVENOUS | Status: AC
  Administered 2017-07-06 (×3): 10 meq via INTRAVENOUS
  Filled 2017-07-06 (×3): qty 100

## 2017-07-06 MED ORDER — FENTANYL CITRATE (PF) 100 MCG/2ML IJ SOLN
50.0000 ug | Freq: Once | INTRAMUSCULAR | Status: AC
  Administered 2017-07-06: 50 ug via INTRAVENOUS

## 2017-07-06 MED ORDER — HYDRALAZINE HCL 20 MG/ML IJ SOLN: 10.0000 mg | Freq: Four times a day (QID) | INTRAMUSCULAR | Status: DC | PRN

## 2017-07-06 MED ORDER — KETOROLAC TROMETHAMINE 15 MG/ML IJ SOLN
15.0000 mg | Freq: Four times a day (QID) | INTRAMUSCULAR | Status: DC | PRN
  Administered 2017-07-06 – 2017-07-07 (×2): 15 mg via INTRAVENOUS
  Filled 2017-07-06 (×2): qty 1

## 2017-07-06 MED ORDER — GADOBENATE DIMEGLUMINE 529 MG/ML IV SOLN
8.0000 mL | Freq: Once | INTRAVENOUS | Status: AC | PRN
  Administered 2017-07-06: 10 mL via INTRAVENOUS

## 2017-07-06 MED ORDER — HEPARIN SODIUM (PORCINE) 5000 UNIT/ML IJ SOLN
5000.0000 [IU] | Freq: Three times a day (TID) | INTRAMUSCULAR | Status: DC
  Administered 2017-07-06: 5000 [IU] via SUBCUTANEOUS
  Filled 2017-07-06: qty 1

## 2017-07-06 MED ORDER — PIPERACILLIN-TAZOBACTAM 3.375 G IVPB
3.3750 g | Freq: Three times a day (TID) | INTRAVENOUS | Status: DC
  Administered 2017-07-06 – 2017-07-07 (×3): 3.375 g via INTRAVENOUS
  Filled 2017-07-06 (×6): qty 50

## 2017-07-06 MED ORDER — DOCUSATE SODIUM 100 MG PO CAPS
100.0000 mg | ORAL_CAPSULE | Freq: Two times a day (BID) | ORAL | Status: DC
  Administered 2017-07-06 – 2017-07-07 (×3): 100 mg via ORAL
  Filled 2017-07-06 (×3): qty 1

## 2017-07-06 MED ORDER — CLINDAMYCIN PHOSPHATE 600 MG/50ML IV SOLN
600.0000 mg | Freq: Once | INTRAVENOUS | Status: AC
  Administered 2017-07-06: 600 mg via INTRAVENOUS
  Filled 2017-07-06: qty 50

## 2017-07-06 MED ORDER — SENNA 8.6 MG PO TABS
1.0000 | ORAL_TABLET | Freq: Two times a day (BID) | ORAL | Status: DC
  Administered 2017-07-06 – 2017-07-07 (×3): 8.6 mg via ORAL
  Filled 2017-07-06 (×3): qty 1

## 2017-07-06 MED ORDER — POTASSIUM CHLORIDE IN NACL 20-0.9 MEQ/L-% IV SOLN
INTRAVENOUS | Status: DC
  Administered 2017-07-06 – 2017-07-07 (×2): via INTRAVENOUS
  Filled 2017-07-06 (×4): qty 1000

## 2017-07-06 MED ORDER — ACETAMINOPHEN 325 MG PO TABS: 650.0000 mg | ORAL_TABLET | Freq: Four times a day (QID) | ORAL | Status: DC | PRN

## 2017-07-06 MED ORDER — HYDROCODONE-ACETAMINOPHEN 5-325 MG PO TABS: 1.0000 | ORAL_TABLET | ORAL | Status: DC | PRN

## 2017-07-06 MED ORDER — FLEET ENEMA 7-19 GM/118ML RE ENEM: 1.0000 | ENEMA | Freq: Once | RECTAL | Status: DC

## 2017-07-06 NOTE — Consult Note (Signed)
Patient ID: Jasmine Stokes, female   DOB: 07-16-1919, 83 y.o.   MRN: [id number]  HPI Jasmine Stokes is a 83 y.o. female asked to see in consultation by Dr.Mody. She came into the emergency room this morning complaining of right sided abdominal pain. She states that her pain has been present for several days. Usually the pain gets worse when she moves. Pain is moderate in intensity and dull in nature. The patient did have a fall last week and now has cellulitis on her left leg. No vomiting no diarrhea no fevers no chills no evidence of biliary obstruction or cholangitis. She does have a history of constipation. CT scan personally reviewed revealed evidence of cholelithiasis with dilation of the intrahepatic biliary duct. No evidence of pancreatic mass or images to explain the dilation of the common bile duct. Her significant stool within the colon. There is some mild dilated loops of small bowel. No evidence of abscesses no evidence of free air or pneumatosis. Ultrasound showing multiple stones and CBD measuring 10 mm Her total bilirubin is 1.5 and her CBC is normal. Her potassium is 3.2  HPI  Past Medical History:  Diagnosis Date  . Cancer ([place])   . Hypertension     Past Surgical History:  Procedure Laterality Date  . BREAST LUMPECTOMY     bilateral  . EYE SURGERY    . INTRAMEDULLARY (IM) NAIL INTERTROCHANTERIC Right 03/02/2016   Procedure: INTRAMEDULLARY (IM) NAIL INTERTROCHANTRIC;  Surgeon: Thornton Park, MD;  Location: [facility] ORS;  Service: Orthopedics;  Laterality: Right;    History reviewed. No pertinent family history.  Social History Social History  Substance Use Topics  . Smoking status: Never Smoker  . Smokeless tobacco: Never Used  . Alcohol use No    No Known Allergies  Current Facility-Administered Medications  Medication Dose Route Frequency Provider Last Rate Last Dose  . 0.9 % NaCl with KCl 20 mEq/ L  infusion   Intravenous Continuous Bettey Costa, MD 75 mL/hr at  07/06/17 1055    . acetaminophen (TYLENOL) tablet 650 mg  650 mg Oral Q6H PRN Bettey Costa, MD       Or  . acetaminophen (TYLENOL) suppository 650 mg  650 mg Rectal Q6H PRN Benjie Karvonen, Sital, MD      . docusate sodium (COLACE) capsule 100 mg  100 mg Oral BID Bettey Costa, MD   100 mg at 07/06/17 1021  . heparin injection 5,000 Units  5,000 Units Subcutaneous Q8H Mody, Sital, MD      . hydrALAZINE (APRESOLINE) injection 10 mg  10 mg Intravenous Q6H PRN Mody, Sital, MD      . HYDROcodone-acetaminophen (NORCO/VICODIN) 5-325 MG per tablet 1-2 tablet  1-2 tablet Oral Q4H PRN Bettey Costa, MD      . Derrill Memo ON 07/07/2017] Influenza vac split quadrivalent PF (FLUZONE HIGH-DOSE) injection 0.5 mL  0.5 mL Intramuscular Tomorrow-1000 Mody, Sital, MD      . ketorolac (TORADOL) 15 MG/ML injection 15 mg  15 mg Intravenous Q6H PRN Mody, Sital, MD      . piperacillin-tazobactam (ZOSYN) IVPB 3.375 g  3.375 g Intravenous Q8H Bettey Costa, MD   Stopped at 07/06/17 0935  . polyethylene glycol (MIRALAX / GLYCOLAX) packet 17 g  17 g Oral Daily PRN Mody, Sital, MD      . potassium chloride 10 mEq in 100 mL IVPB  10 mEq Intravenous Q1 Hr x 3 Mody, Sital, MD 100 mL/hr at 07/06/17 1021 10 mEq at 07/06/17 1021  .  senna (SENOKOT) tablet 8.6 mg  1 tablet Oral BID Bettey Costa, MD   8.6 mg at 07/06/17 1021  . sodium phosphate (FLEET) 7-19 GM/118ML enema 1 enema  1 enema Rectal Once Bettey Costa, MD         Review of Systems Full ROS  was asked and was negative except for the information on the HPI  Physical Exam Blood pressure (!) 158/65, pulse 81, temperature 98.1 F (36.7 C), temperature source Oral, resp. rate 18, height 5\' 2"  (1.575 m), weight 41.3 kg (91 lb), SpO2 100 %. CONSTITUTIONAL: elderly and debilitated female in NAD  EYES: Pupils are equal, round,  Sclera are non-icteric. EARS, NOSE, MOUTH AND THROAT: The oral mucosa is pink and moist. Hearing is intact to voice. LYMPH NODES:  Lymph nodes in the neck are  normal. RESPIRATORY:  Lungs are clear. There is normal respiratory effort, with equal breath sounds bilaterally, and without pathologic use of accessory muscles. CARDIOVASCULAR: Heart is regular without murmurs, gallops, or rubs. GI: The abdomen is  Soft, She is distended mildlyu. Mild TTP RUQ, no peritonitis. GU: Rectal deferred.   MUSCULOSKELETAL:There is erythema on the left leg consistent with cellulitis is also significant edema and tenderness to palpation. SKIN: Turgor is good and there are no pathologic skin lesions or ulcers. NEUROLOGIC: Motor and sensation is grossly normal. Cranial nerves are grossly intact. PSYCH:  Oriented to person, place and time. Affect is normal.  Data Reviewed I have personally reviewed the patient's imaging, laboratory findings and medical records.    Assessment /Plan 76-year-old female with multiple medical issues now presented with abdominal pain and evidence of dilated common bile duct with gallstones. Not classic picture of cholecystitis. His symptoms can certainly be related to her constipation as well. No evidence of biliary obstruction. I do recommend further workup with an MRCP to assess for potential choledocholithiasis or obstructed but process within the common bile duct versus a neoplasm. Patient is 26 and debilitated and I doubt that any surgical revision would be in her best interest. Discussed with Dr.Mody and the patient in detail. We'll continue to follow  Caroleen Hamman, MD FACS General Surgeon 07/06/2017, 11:06 AM

## 2017-07-06 NOTE — Progress Notes (Signed)
Pharmacy Antibiotic Note  Jasmine Stokes is a 83 y.o. female admitted on 07/06/2017 with Intra-abdominal infection.  Pharmacy has been consulted for Zosyn dosing.  Plan: Zosyn 3.375g IV q8h (4 hour infusion).  Height: 5\' 2"  (157.5 cm) Weight: 91 lb (41.3 kg) IBW/kg (Calculated) : 50.1  Temp (24hrs), Avg:98 F (36.7 C), Min:97.8 F (36.6 C), Max:98.1 F (36.7 C)   Recent Labs Lab 07/06/17 0316 07/06/17 0428  WBC 6.5  --   CREATININE 0.99  --   LATICACIDVEN  --  1.1    Estimated Creatinine Clearance: 21.7 mL/min (by C-G formula based on SCr of 0.99 mg/dL).    No Known Allergies  Antimicrobials this admission: zosyn 9/16 >>       >>    Dose adjustments this admission:    Microbiology results: 9/16 BCx:     UCx:      Sputum:      MRSA PCR:    Thank you for allowing pharmacy to be a part of this patient's care.  Nereyda Bowler A 07/06/2017 11:26 AM

## 2017-07-06 NOTE — Progress Notes (Signed)
Patient a&o, intermittently confused, easily redirected. IV fluids infusing. Nephew at bedside. No complaints at this time. Patient comfortable sleeping in between care. Patient however complains of right upper and lower quadrant pain while moving/turning. Continue to monitor.

## 2017-07-06 NOTE — Progress Notes (Signed)
MEDICATION RELATED CONSULT NOTE - INITIAL   Pharmacy Consult for Electrolyte REplacement Indication: hypokalemia  No Known Allergies   Labs:  Recent Labs  07/06/17 0316  WBC 6.5  HGB 12.9  HCT 37.7  PLT 238  CREATININE 0.99  MG 1.9  ALBUMIN 4.0  PROT 6.9  AST 37  ALT 26  ALKPHOS 109  BILITOT 1.5*   Lab Results  Component Value Date   K 3.2 (L) 07/06/2017   Estimated Creatinine Clearance: 21.7 mL/min (by C-G formula based on SCr of 0.99 mg/dL).  Medical History: Past Medical History:  Diagnosis Date  . Cancer ([place])   . Hypertension     Medications:  Scheduled:  . docusate sodium  100 mg Oral BID  . heparin subcutaneous  5,000 Units Subcutaneous Q8H  . [START ON 07/07/2017] Influenza vac split quadrivalent PF  0.5 mL Intramuscular Tomorrow-1000  . senna  1 tablet Oral BID  . sodium phosphate  1 enema Rectal Once   Infusions:  . 0.9 % NaCl with KCl 20 mEq / L 75 mL/hr at 07/06/17 1055  . piperacillin-tazobactam (ZOSYN)  IV Stopped (07/06/17 0935)  . potassium chloride 10 mEq (07/06/17 1021)    Assessment: 83 yo F with hypokalemia, dilated common bile duct with gallstones,   Goal of Therapy:  electrolytes WNL  Plan:  K 3.2,  Mag 1.9.  Will order KCL 46meq IV x 3. MD has since ordered IV fluids NS w/ KCL 64meq at 75 ml/hr. F/u labs in am.  Elese Rane A 07/06/2017,11:28 AM

## 2017-07-06 NOTE — H&P (Signed)
[facility] NAME: Jasmine Stokes    MR#:  [id number]  DATE OF BIRTH:  Jan 24, 1919  DATE OF ADMISSION:  07/06/2017  PRIMARY CARE PHYSICIAN: Madelyn Brunner, MD   REQUESTING/REFERRING PHYSICIAN: dr Corky Downs  CHIEF COMPLAINT:    Abdominal pain HISTORY OF PRESENT ILLNESS:  Jasmine Stokes  is a 83 y.o. female with a known history of Hypertension who presents with above complaint. Patient reports that she has had right-sided abdominal pain over the past 24 hours associated with nausea but no vomiting. She reports that the pain occurs when she tries to eat or moved. She has had no fevers, chills or diarrhea. She is currently having 6 out of 10 pain.   It is also noted she has a left lower extremity cellulitis. She does report that her left leg is more painful and swollen. She had a scab which she picked on her left leg.   PAST MEDICAL HISTORY:   Past Medical History:  Diagnosis Date  . Cancer ([place])   . Hypertension     PAST SURGICAL HISTORY:   Past Surgical History:  Procedure Laterality Date  . BREAST LUMPECTOMY     bilateral  . EYE SURGERY    . INTRAMEDULLARY (IM) NAIL INTERTROCHANTERIC Right 03/02/2016   Procedure: INTRAMEDULLARY (IM) NAIL INTERTROCHANTRIC;  Surgeon: Thornton Park, MD;  Location: [facility] ORS;  Service: Orthopedics;  Laterality: Right;    SOCIAL HISTORY:   Social History  Substance Use Topics  . Smoking status: Never Smoker  . Smokeless tobacco: Never Used  . Alcohol use No    FAMILY HISTORY:  Positive hypertension  DRUG ALLERGIES:  No Known Allergies  REVIEW OF SYSTEMS:   Review of Systems  Constitutional: Negative.  Negative for chills, fever and malaise/fatigue.  HENT: Negative.  Negative for ear discharge, ear pain, hearing loss, nosebleeds and sore throat.   Eyes: Negative.  Negative for blurred vision and pain.  Respiratory: Negative.  Negative for cough, hemoptysis, shortness of breath and  wheezing.   Cardiovascular: Positive for leg swelling. Negative for chest pain and palpitations.  Gastrointestinal: Positive for abdominal pain and nausea. Negative for blood in stool, diarrhea and vomiting.  Genitourinary: Negative.  Negative for dysuria.  Musculoskeletal: Negative.  Negative for back pain.  Skin:       Redness leg  Neurological: Negative for dizziness, tremors, speech change, focal weakness, seizures and headaches.  Endo/Heme/Allergies: Negative.  Does not bruise/bleed easily.  Psychiatric/Behavioral: Negative.  Negative for depression, hallucinations and suicidal ideas.    MEDICATIONS AT HOME:   Prior to Admission medications   Medication Sig Start Date End Date Taking? Authorizing Provider  ALPRAZolam (XANAX) 0.25 MG tablet Take 1-2 tablets (0.25-0.5 mg total) by mouth at bedtime as needed. 03/05/16   Theodoro Grist, MD  alum & mag hydroxide-simeth (MAALOX/MYLANTA) 200-200-20 MG/5ML suspension Take 30 mLs by mouth every 4 (four) hours as needed for indigestion. 03/05/16   Theodoro Grist, MD  anastrozole (ARIMIDEX) 1 MG tablet Take 1 tablet by mouth daily. Reported on 03/01/2016    [provider]  bisacodyl (DULCOLAX) 10 MG suppository Place 1 suppository (10 mg total) rectally daily as needed for moderate constipation. 03/05/16   Theodoro Grist, MD  docusate sodium (COLACE) 100 MG capsule Take 1 capsule (100 mg total) by mouth 2 (two) times daily. 03/05/16   Theodoro Grist, MD  enoxaparin (LOVENOX) 30 MG/0.3ML injection Inject 0.3 mLs (30 mg total) into the skin daily.  03/05/16   Theodoro Grist, MD  feeding supplement, ENSURE ENLIVE, (ENSURE ENLIVE) LIQD Take 237 mLs by mouth 2 (two) times daily between meals. 03/05/16   Theodoro Grist, MD  ferrous sulfate 325 (65 FE) MG tablet Take 1 tablet (325 mg total) by mouth 3 (three) times daily after meals. 03/05/16   Theodoro Grist, MD  ibuprofen (ADVIL,MOTRIN) 200 MG tablet Take 1 tablet by mouth every 6 (six) hours as  needed. Reported on 03/01/2016    [provider]  metoprolol tartrate (LOPRESSOR) 25 MG tablet Take 0.5 tablets (12.5 mg total) by mouth 2 (two) times daily. 03/05/16   Theodoro Grist, MD  oxyCODONE (OXY IR/ROXICODONE) 5 MG immediate release tablet Take 1-2 tablets (5-10 mg total) by mouth every 4 (four) hours as needed for breakthrough pain ((for MODERATE breakthrough pain)). 03/05/16   Theodoro Grist, MD  polyethylene glycol (MIRALAX / GLYCOLAX) packet Take 17 g by mouth daily as needed for mild constipation. 03/05/16   Theodoro Grist, MD  senna (SENOKOT) 8.6 MG TABS tablet Take 1 tablet (8.6 mg total) by mouth 2 (two) times daily. 03/05/16   Theodoro Grist, MD  Vitamin D, Ergocalciferol, (DRISDOL) 50000 units CAPS capsule Take 1 capsule by mouth once a week. Reported on 03/01/2016    [provider]      VITAL SIGNS:  Blood pressure 134/67, pulse 79, temperature 97.8 F (36.6 C), temperature source Oral, resp. rate 16, height 5\' 2"  (1.575 m), weight 41.3 kg (91 lb), SpO2 95 %.  PHYSICAL EXAMINATION:   Physical Exam  Constitutional: She is oriented to person, place, and time and well-developed, well-nourished, and in no distress. No distress.  HENT:  Head: Normocephalic.  Eyes: No scleral icterus.  Neck: Normal range of motion. Neck supple. No JVD present. No tracheal deviation present.  Cardiovascular: Normal rate, regular rhythm and normal heart sounds.  Exam reveals no gallop and no friction rub.   No murmur heard. Pulmonary/Chest: Effort normal and breath sounds normal. No respiratory distress. She has no wheezes. She has no rales. She exhibits no tenderness.  Abdominal: Soft. Bowel sounds are normal. She exhibits no distension and no mass. There is tenderness. There is no rebound and no guarding.  Musculoskeletal: Normal range of motion. She exhibits no edema.  Neurological: She is alert and oriented to person, place, and time.  Skin: Skin is warm. No rash noted. There  is erythema.  Left lower extremity with 3+ edema, erythema, pain and tenderness. Small 3 mm circular area on tibia.  Psychiatric: Affect and judgment normal.      LABORATORY PANEL:   CBC  Recent Labs Lab 07/06/17 0316  WBC 6.5  HGB 12.9  HCT 37.7  PLT 238   ------------------------------------------------------------------------------------------------------------------  Chemistries   Recent Labs Lab 07/06/17 0316  NA 139  K 3.2*  CL 101  CO2 27  GLUCOSE 111*  BUN 29*  CREATININE 0.99  CALCIUM 9.3  AST 37  ALT 26  ALKPHOS 109  BILITOT 1.5*   ------------------------------------------------------------------------------------------------------------------  Cardiac Enzymes No results for input(s): TROPONINI in the last 168 hours. ------------------------------------------------------------------------------------------------------------------  RADIOLOGY:  Ct Abdomen Pelvis W Contrast  Result Date: 07/06/2017 CLINICAL DATA:  Right-sided abdominal pain. Abdominal distension. Fall last week. EXAM: CT ABDOMEN AND PELVIS WITH CONTRAST TECHNIQUE: Multidetector CT imaging of the abdomen and pelvis was performed using the standard protocol following bolus administration of intravenous contrast. CONTRAST:  31mL ISOVUE-300 IOPAMIDOL (ISOVUE-300) INJECTION 61% COMPARISON:  PET-CT 04/04/2011 FINDINGS: Lower chest: Motion artifact. Mild  basilar atelectasis. Heart size upper normal. There are coronary artery calcifications. Hepatobiliary: Multiple gallstones including a 12 mm stone in the gallbladder neck. There is mild intrahepatic biliary ductal dilatation, the common bile duct measures 8 mm, may be normal for age. No calcified choledocholithiasis. No focal hepatic lesion. Pancreas: No ductal dilatation or inflammation. Spleen: Normal in size without focal abnormality. Splenule anteriorly. Adrenals/Urinary Tract: Mild left adrenal thickening. Right adrenal gland is normal. No  hydronephrosis or perinephric edema. Small low-density lesions within both kidneys are incompletely characterized due to size but likely small cysts. Urinary bladder is distended. No bladder wall thickening. Stomach/Bowel: Lack of enteric contrast and paucity of intra-abdominal fat limits bowel assessment. Stomach is nondistended. No evidence of bowel inflammation or wall thickening. Fecalization of small bowel contents suggesting slow transit. Few prominent air-filled pelvic small bowel loops. Colonic tortuosity with moderate diffuse colonic stool burden. Appendix not confidently visualized. Vascular/Lymphatic: Mild aortic atherosclerosis without aneurysm. No adenopathy. Reproductive: Atrophic uterus, normal for age. 2.7 cm left adnexal structure is unchanged from 2012 examine presumed benign. Other: No intra-abdominal free fluid or free air. Musculoskeletal: Compression fractures at T11 and L1, likely chronic. Bones are diffusely under mineralized. Postsurgical change in the right proximal femur. No acute fracture. IMPRESSION: 1. Cholelithiasis. Intrahepatic biliary ductal dilatation with prominent common bile duct, no definite choledocholithiasis. Given right-sided pain, recommend right upper quadrant ultrasound. 2. Moderate colonic stool burden with colonic tortuosity and fecalization of small bowel contents suggesting slow transit. Few prominent air-filled small bowel loops in the lower abdomen, likely mild ileus. 3. Compression fractures of T11 and L1, technically age indeterminate but likely chronic. 4. Aortic Atherosclerosis (ICD10-I70.0). Coronary artery calcifications. Electronically Signed   By: Jeb Levering M.D.   On: 07/06/2017 05:44   US Abdomen Limited Ruq  Result Date: 07/06/2017 CLINICAL DATA:  Abdominal pain for 1 day. EXAM: ULTRASOUND ABDOMEN LIMITED RIGHT UPPER QUADRANT COMPARISON:  CT scan from earlier today FINDINGS: Gallbladder: 2 stones are seen in the gallbladder with a 20 mm stone  in the fundus and a non mobile 15 mm stone in the neck. No gallbladder wall thickening, pericholecystic fluid, or Murphy's sign. Common bile duct: Diameter: 10 mm Liver: Mild intrahepatic ductal dilatation. No focal mass. Portal vein is patent on color Doppler imaging with normal direction of blood flow towards the liver. IMPRESSION: 1. Two large stones are seen in the gallbladder with a 20 mm stone in the fundus and a 15 mm non mobile stone in the neck. However, no wall thickening, pericholecystic fluid, or Murphy's sign are identified. 2. The common bile duct measures 10 mm and there is central intrahepatic ductal dilatation. Findings are concerning for central obstruction. An MRCP could better evaluate. Electronically Signed   By: Dorise Bullion III M.D   On: 07/06/2017 07:44    EKG:   Normal sinus rhythm heart rate 91 no ST elevation or depression  IMPRESSION AND PLAN:   83 year old female with history of hypertension who presents with abdominal pain and left lower extremity cellulitis.  1. Abdominal pain concerning for choledocholithiasis with possible ileus MRCP ordered. GI and surgery consultations requested. Discussed case with surgeon. Start empiric Zosyn Nothing by mouth Trend LFTs  2. Left lower extremity cellulitis: X-ray to evaluate osteo- Doppler to evaluate DVT Wound care Consultation requested Elevate leg Continue Zosyn  3. Essential hypertension: Patient is nothing by mouth and therefore will use hydralazine when necessary  4. Hypokalemia: Replete and recheck in a.m.    All the  records are reviewed and case discussed with ED provider. Management plans discussed with the patient and daughter and they are in agreement  CODE STATUS: DO NOT RESUSCITATE  TOTAL TIME TAKING CARE OF THIS PATIENT: 45 minutes.    Barney Gertsch M.D on 07/06/2017 at 8:38 AM  Between 7am to 6pm - Pager - [phone]  After 6pm go to [email or web address] - password [email or web address]  [facility]  Office  [phone]  CC: Primary care physician; Madelyn Brunner, MD

## 2017-07-06 NOTE — Consult Note (Signed)
Reason for Consult: Abdominal pain Referring Physician: Dr. Radene Stokes is an 83 y.o. female.  HPI: Seen in consultation at the request of Jasmine Stokes for further evaluation of abdominal pain. The history is obtained through the patient, my review of EPIC, and my discussion with Jasmine Stokes.  She was admitted through the Emergency Department this morning with a one day history of dull, moderate intensity, right-sided abdominal pain. The pain worsened with movement. She's had a large bowel movement since she arrived and the pain has nearly resolved.  She notes worsening constipation over the last several weeks. She has a lifetime history of constipation and will frequently go several days without a bowel movement. When she did not have a bowel movement last week, she was going to go buy some laxatives. However, she fell and was not able to get to the store. She is unable to remember when her last bowel movement was, but, it has been at least a couple of weeks.  No nausea, vomiting, dysphagia, odynophagia, BRBPR, hematochezia, or melena. She has never had any endoscopy or any colon cancer screening.  A CT scan was performed in the emergency room showing cholelithiasis with dilation of the intrahepatic biliary duct, normal pancreas, mild dilated loops of small bowel, and a significant amount of stool. An abdominal ultrasound showed multiple stones and a common bile duct measuring 5m.  She was started on empiric Zosyn. A surgery consultation did not find any indications for surgery at this time. An MRCP was recommended.  Past Medical History:  Diagnosis Date  . Cancer ([place]   . Hypertension     Past Surgical History:  Procedure Laterality Date  . BREAST LUMPECTOMY     bilateral  . EYE SURGERY    . INTRAMEDULLARY (IM) NAIL INTERTROCHANTERIC Right 03/02/2016   Procedure: INTRAMEDULLARY (IM) NAIL INTERTROCHANTRIC;  Surgeon: Jasmine Park MD;  Location: [facility] ORS;  Service: Orthopedics;   Laterality: Right;    History reviewed. No pertinent family history.  Social History:  reports that she has never smoked. She has never used smokeless tobacco. She reports that she does not drink alcohol or use drugs.  Allergies: No Known Allergies  Medications:  I have reviewed the patient's current medications. Prior to Admission:  Prescriptions Prior to Admission  Medication Sig Dispense Refill Last Dose  . ALPRAZolam (XANAX) 0.25 MG tablet Take 1-2 tablets (0.25-0.5 mg total) by mouth at bedtime as needed. 30 tablet 0   . alum & mag hydroxide-simeth (MAALOX/MYLANTA) 200-200-20 MG/5ML suspension Take 30 mLs by mouth every 4 (four) hours as needed for indigestion. 355 mL 0   . amLODipine (NORVASC) 2.5 MG tablet Take 1 tablet by mouth daily.     .Marland Kitchenanastrozole (ARIMIDEX) 1 MG tablet Take 1 tablet by mouth daily. Reported on 03/01/2016   Completed Course at Unknown time  . bisacodyl (DULCOLAX) 10 MG suppository Place 1 suppository (10 mg total) rectally daily as needed for moderate constipation. 12 suppository 0   . docusate sodium (COLACE) 100 MG capsule Take 1 capsule (100 mg total) by mouth 2 (two) times daily. 10 capsule 0   . enoxaparin (LOVENOX) 30 MG/0.3ML injection Inject 0.3 mLs (30 mg total) into the skin daily. 14 Syringe 0   . feeding supplement, ENSURE ENLIVE, (ENSURE ENLIVE) LIQD Take 237 mLs by mouth 2 (two) times daily between meals. 237 mL 12   . ferrous sulfate 325 (65 FE) MG tablet Take 1 tablet (325 mg total) by mouth  3 (three) times daily after meals. 90 tablet 3   . furosemide (LASIX) 20 MG tablet Take 1 tablet by mouth every other day.     . ibuprofen (ADVIL,MOTRIN) 200 MG tablet Take 1 tablet by mouth every 6 (six) hours as needed. Reported on 03/01/2016   Completed Course at Unknown time  . metoprolol tartrate (LOPRESSOR) 25 MG tablet Take 0.5 tablets (12.5 mg total) by mouth 2 (two) times daily. 60 tablet 5   . oxyCODONE (OXY IR/ROXICODONE) 5 MG immediate release  tablet Take 1-2 tablets (5-10 mg total) by mouth every 4 (four) hours as needed for breakthrough pain ((for MODERATE breakthrough pain)). 30 tablet 0   . polyethylene glycol (MIRALAX / GLYCOLAX) packet Take 17 g by mouth daily as needed for mild constipation. 14 each 0   . senna (SENOKOT) 8.6 MG TABS tablet Take 1 tablet (8.6 mg total) by mouth 2 (two) times daily. 120 each 0   . Vitamin D, Ergocalciferol, (Jasmine Stokes) 50000 units CAPS capsule Take 1 capsule by mouth once a week. Reported on 03/01/2016   Completed Course at Unknown time   Scheduled: . docusate sodium  100 mg Oral BID  . heparin subcutaneous  5,000 Units Subcutaneous Q8H  . [START ON 07/07/2017] Influenza vac split quadrivalent PF  0.5 mL Intramuscular Tomorrow-1000  . senna  1 tablet Oral BID  . sodium phosphate  1 enema Rectal Once   Continuous: . 0.9 % NaCl with KCl 20 mEq / L 75 mL/hr at 07/06/17 1055  . piperacillin-tazobactam (ZOSYN)  IV Stopped (07/06/17 0935)  . potassium chloride 10 mEq (07/06/17 1238)   UXL:[id number] **OR** acetaminophen, hydrALAZINE, HYDROcodone-acetaminophen, ketorolac, polyethylene glycol  Results for orders placed or performed during the hospital encounter of 07/06/17 (from the past 48 hour(s))  Lipase, blood     Status: None   Collection Time: 07/06/17  3:16 AM  Result Value Ref Range   Lipase 24 11 - 51 U/L  Comprehensive metabolic panel     Status: Abnormal   Collection Time: 07/06/17  3:16 AM  Result Value Ref Range   Sodium 139 135 - 145 mmol/L   Potassium 3.2 (L) 3.5 - 5.1 mmol/L   Chloride 101 101 - 111 mmol/L   CO2 27 22 - 32 mmol/L   Glucose, Bld 111 (H) 65 - 99 mg/dL   BUN 29 (H) 6 - 20 mg/dL   Creatinine, Ser 0.99 0.44 - 1.00 mg/dL   Calcium 9.3 8.9 - 10.3 mg/dL   Total Protein 6.9 6.5 - 8.1 g/dL   Albumin 4.0 3.5 - 5.0 g/dL   AST 37 15 - 41 U/L   ALT 26 14 - 54 U/L   Alkaline Phosphatase 109 38 - 126 U/L   Total Bilirubin 1.5 (H) 0.3 - 1.2 mg/dL   GFR calc non Af Amer  47 (L) >60 mL/min   GFR calc Af Amer 54 (L) >60 mL/min    Comment: (NOTE) The eGFR has been calculated using the CKD EPI equation. This calculation has not been validated in all clinical situations. eGFR's persistently <60 mL/min signify possible Chronic Kidney Disease.    Anion gap 11 5 - 15  CBC with Differential/Platelet     Status: None   Collection Time: 07/06/17  3:16 AM  Result Value Ref Range   WBC 6.5 3.6 - 11.0 K/uL   RBC 4.50 3.80 - 5.20 MIL/uL   Hemoglobin 12.9 12.0 - 16.0 g/dL   HCT 37.7 35.0 - 47.0 %  MCV 83.9 80.0 - 100.0 fL   MCH 28.7 26.0 - 34.0 pg   MCHC 34.2 32.0 - 36.0 g/dL   RDW 13.6 11.5 - 14.5 %   Platelets 238 150 - 440 K/uL   Neutrophils Relative % 74 %   Neutro Abs 4.8 1.4 - 6.5 K/uL   Lymphocytes Relative 15 %   Lymphs Abs 1.0 1.0 - 3.6 K/uL   Monocytes Relative 8 %   Monocytes Absolute 0.5 0.2 - 0.9 K/uL   Eosinophils Relative 2 %   Eosinophils Absolute 0.1 0 - 0.7 K/uL   Basophils Relative 1 %   Basophils Absolute 0.1 0 - 0.1 K/uL  Magnesium     Status: None   Collection Time: 07/06/17  3:16 AM  Result Value Ref Range   Magnesium 1.9 1.7 - 2.4 mg/dL  Urinalysis, Complete w Microscopic     Status: Abnormal   Collection Time: 07/06/17  3:17 AM  Result Value Ref Range   Color, Urine STRAW (A) YELLOW   APPearance CLEAR (A) CLEAR   Specific Gravity, Urine 1.012 1.005 - 1.030   pH 6.0 5.0 - 8.0   Glucose, UA NEGATIVE NEGATIVE mg/dL   Hgb urine dipstick NEGATIVE NEGATIVE   Bilirubin Urine NEGATIVE NEGATIVE   Ketones, ur 5 (A) NEGATIVE mg/dL   Protein, ur NEGATIVE NEGATIVE mg/dL   Nitrite NEGATIVE NEGATIVE   Leukocytes, UA NEGATIVE NEGATIVE   RBC / HPF NONE SEEN 0 - 5 RBC/hpf   WBC, UA 0-5 0 - 5 WBC/hpf   Bacteria, UA RARE (A) NONE SEEN   Squamous Epithelial / LPF 0-5 (A) NONE SEEN  Lactic acid, plasma     Status: None   Collection Time: 07/06/17  4:28 AM  Result Value Ref Range   Lactic Acid, Venous 1.1 0.5 - 1.9 mmol/L    Ct Abdomen  Pelvis W Contrast  Result Date: 07/06/2017 CLINICAL DATA:  Right-sided abdominal pain. Abdominal distension. Fall last week. EXAM: CT ABDOMEN AND PELVIS WITH CONTRAST TECHNIQUE: Multidetector CT imaging of the abdomen and pelvis was performed using the standard protocol following bolus administration of intravenous contrast. CONTRAST:  45m ISOVUE-300 IOPAMIDOL (ISOVUE-300) INJECTION 61% COMPARISON:  PET-CT 04/04/2011 FINDINGS: Lower chest: Motion artifact. Mild basilar atelectasis. Heart size upper normal. There are coronary artery calcifications. Hepatobiliary: Multiple gallstones including a 12 mm stone in the gallbladder neck. There is mild intrahepatic biliary ductal dilatation, the common bile duct measures 8 mm, may be normal for age. No calcified choledocholithiasis. No focal hepatic lesion. Pancreas: No ductal dilatation or inflammation. Spleen: Normal in size without focal abnormality. Splenule anteriorly. Adrenals/Urinary Tract: Mild left adrenal thickening. Right adrenal gland is normal. No hydronephrosis or perinephric edema. Small low-density lesions within both kidneys are incompletely characterized due to size but likely small cysts. Urinary bladder is distended. No bladder wall thickening. Stomach/Bowel: Lack of enteric contrast and paucity of intra-abdominal fat limits bowel assessment. Stomach is nondistended. No evidence of bowel inflammation or wall thickening. Fecalization of small bowel contents suggesting slow transit. Few prominent air-filled pelvic small bowel loops. Colonic tortuosity with moderate diffuse colonic stool burden. Appendix not confidently visualized. Vascular/Lymphatic: Mild aortic atherosclerosis without aneurysm. No adenopathy. Reproductive: Atrophic uterus, normal for age. 2.7 cm left adnexal structure is unchanged from 2012 examine presumed benign. Other: No intra-abdominal free fluid or free air. Musculoskeletal: Compression fractures at T11 and L1, likely chronic.  Bones are diffusely under mineralized. Postsurgical change in the right proximal femur. No acute fracture. IMPRESSION: 1. Cholelithiasis.  Intrahepatic biliary ductal dilatation with prominent common bile duct, no definite choledocholithiasis. Given right-sided pain, recommend right upper quadrant ultrasound. 2. Moderate colonic stool burden with colonic tortuosity and fecalization of small bowel contents suggesting slow transit. Few prominent air-filled small bowel loops in the lower abdomen, likely mild ileus. 3. Compression fractures of T11 and L1, technically age indeterminate but likely chronic. 4. Aortic Atherosclerosis (ICD10-I70.0). Coronary artery calcifications. Electronically Signed   By: Jeb Levering M.D.   On: 07/06/2017 05:44   US Venous Img Lower Unilateral Left  Result Date: 07/06/2017 CLINICAL DATA:  Edema. EXAM: LEFT LOWER EXTREMITY VENOUS DOPPLER ULTRASOUND TECHNIQUE: Gray-scale sonography with graded compression, as well as color Doppler and duplex ultrasound were performed to evaluate the lower extremity deep venous systems from the level of the common femoral vein and including the common femoral, femoral, profunda femoral, popliteal and calf veins including the posterior tibial, peroneal and gastrocnemius veins when visible. The superficial great saphenous vein was also interrogated. Spectral Doppler was utilized to evaluate flow at rest and with distal augmentation maneuvers in the common femoral, femoral and popliteal veins. COMPARISON:  None. FINDINGS: Contralateral Common Femoral Vein: Respiratory phasicity is normal and symmetric with the symptomatic side. No evidence of thrombus. Normal compressibility. Common Femoral Vein: No evidence of thrombus. Normal compressibility, respiratory phasicity and response to augmentation. Saphenofemoral Junction: No evidence of thrombus. Normal compressibility and flow on color Doppler imaging. Profunda Femoral Vein: No evidence of thrombus.  Normal compressibility and flow on color Doppler imaging. Femoral Vein: No evidence of thrombus. Normal compressibility, respiratory phasicity and response to augmentation. Popliteal Vein: No evidence of thrombus. Normal compressibility, respiratory phasicity and response to augmentation. Calf Veins: No evidence of thrombus. Normal compressibility and flow on color Doppler imaging. Superficial Great Saphenous Vein: No evidence of thrombus. Normal compressibility and flow on color Doppler imaging. Venous Reflux:  None. Other Findings:  None. IMPRESSION: No evidence of DVT within the left lower extremity. Soft tissue edema. Electronically Signed   By: Dorise Bullion III M.D   On: 07/06/2017 09:37   Dg Tibia/fibula Left Port  Result Date: 07/06/2017 CLINICAL DATA:  Fall last week.  Pain. EXAM: PORTABLE LEFT TIBIA AND FIBULA - 2 VIEW COMPARISON:  None. FINDINGS: Diffuse soft tissue edema. No fractures identified. No bony erosions. No other acute abnormalities. IMPRESSION: Soft tissue swelling.  No underlying bony abnormality. Electronically Signed   By: Dorise Bullion III M.D   On: 07/06/2017 09:36   US Abdomen Limited Ruq  Result Date: 07/06/2017 CLINICAL DATA:  Abdominal pain for 1 day. EXAM: ULTRASOUND ABDOMEN LIMITED RIGHT UPPER QUADRANT COMPARISON:  CT scan from earlier today FINDINGS: Gallbladder: 2 stones are seen in the gallbladder with a 20 mm stone in the fundus and a non mobile 15 mm stone in the neck. No gallbladder wall thickening, pericholecystic fluid, or Murphy's sign. Common bile duct: Diameter: 10 mm Liver: Mild intrahepatic ductal dilatation. No focal mass. Portal vein is patent on color Doppler imaging with normal direction of blood flow towards the liver. IMPRESSION: 1. Two large stones are seen in the gallbladder with a 20 mm stone in the fundus and a 15 mm non mobile stone in the neck. However, no wall thickening, pericholecystic fluid, or Murphy's sign are identified. 2. The common bile  duct measures 10 mm and there is central intrahepatic ductal dilatation. Findings are concerning for central obstruction. An MRCP could better evaluate. Electronically Signed   By: Dorise Bullion III M.D  On: 07/06/2017 07:44    Review of Systems  Constitutional: Negative for chills, fever and weight loss.  HENT: Negative for hearing loss and tinnitus.   Eyes: Negative for blurred vision and double vision.  Respiratory: Negative for cough and hemoptysis.   Cardiovascular: Negative for chest pain and palpitations.  Gastrointestinal: Positive for abdominal pain and constipation.  Genitourinary: Negative for dysuria and hematuria.  Musculoskeletal: Positive for back pain (Some right sided back pain after her fall last week). Negative for myalgias and neck pain.  Skin: Negative for itching and rash.  Neurological: Negative for dizziness and headaches.  Endo/Heme/Allergies: Negative for environmental allergies. Does not bruise/bleed easily.  Psychiatric/Behavioral: Negative for depression and suicidal ideas.   Blood pressure (!) 158/65, pulse 81, temperature 98.1 F (36.7 C), temperature source Oral, resp. rate 18, height _0  (1.575 m), weight 91 lb (41.3 kg), SpO2 100 %. Physical Exam  Constitutional: She is oriented to person, place, and time. She appears well-developed and well-nourished. No distress.  Appears her stated age  HENT:  Head: Normocephalic and atraumatic.  Mouth/Throat: No oropharyngeal exudate.  Eyes: Conjunctivae are normal. No scleral icterus.  Neck: Normal range of motion. Neck supple. No thyromegaly present.  Cardiovascular: Normal rate and regular rhythm.   Respiratory: Effort normal and breath sounds normal.  GI: Soft. Bowel sounds are normal. She exhibits no distension and no mass. There is tenderness (Mildly tender in the RUQ. No Murphy's sign.). There is no rebound and no guarding.  Musculoskeletal: Normal range of motion. She exhibits edema.  Neurological: She  is alert and oriented to person, place, and time.  Skin: Skin is dry. No rash noted.  Psychiatric: She has a normal mood and affect. Thought content normal.    Assessment/Plan: Acute RUQ abdominal pain, improved after a large BM    - normal WBC, transaminases, lipase, lactic acid Abnormal biliary tree with cholelithiasis Total bilirubin 1.5 Acute on chronic constipation No prior endoscopy or colon cancer screening  No fever or leukocytosis. Symptomatically improved after a large BM today. Constipation may be the cause of her symptoms. Other etiologies must be considered given her abnormal biliary tree and cholelithiasis. Agree with plans for empiric antibiotics and MRCP.   Additional recommendations to follow when the MRCP results are available. If reassuring, will advance her diet as tolerated.  Thank you for allowing me to participate in Mrs. Lonzo's care. Please call with any questions or concerns.   Thornton Stokes 07/06/2017, 12:03 PM

## 2017-07-06 NOTE — ED Notes (Signed)
Pt returned from test. Will get patient up to floor.

## 2017-07-06 NOTE — ED Triage Notes (Signed)
Patient presents to Emergency Department via [facility] with complaints of right upper and lower quad pain after falling last week.  Pt also present with lower left and right leg swelling and cellulitis appearing to the left shin.  Pt lives alone and is oriented and alert.

## 2017-07-06 NOTE — ED Notes (Signed)
Called pharmacy, will send up clindamycin.

## 2017-07-06 NOTE — Progress Notes (Addendum)
Will switch patient from enoxaparin to SQ heparin due to patient's CrCl and age. MD was notified.  Lendon Ka, PharmD Pharmacy Resident

## 2017-07-06 NOTE — Progress Notes (Signed)
Physical Therapy Evaluation Patient Details Name: Jasmine Stokes MRN: [id number] DOB: 1918/11/08 Today's Date: 07/06/2017   History of Present Illness  Pt was admitted for abdominal pain. PMH of HTN, cancer  Clinical Impression  Pt is a pleasant 83 year old female admitted for abdominal pain. Pt performed supine there-ex. Attempted to perform bed mobility with total assist, however pt had difficulty coming from supine to sit with proper technique and body positioning. Pt unable to sit at EOB and perform transfers/amb due to pain and weakness. Pt demonstrates deficits with UE/LE strength, pain, and bed mobility/transfers/amb. Would benefit from skilled PT to address above deficits and promote optimal return to home. Recommend transition to SNF upon DC from acute hospitalization.     Follow Up Recommendations SNF    Equipment Recommendations  None recommended by PT    Recommendations for Other Services       Precautions / Restrictions Precautions Precautions: Fall Restrictions Weight Bearing Restrictions: No      Mobility  Bed Mobility Overal bed mobility: Needs Assistance Bed Mobility: Supine to Sit     Supine to sit: Total assist     General bed mobility comments: Pt attempted to go from supine to sit EOB, however was having significant difficulty holding on to bed rails to come to sitting. Pt appeared very uncomfortable bringing LEs off bed and shifting onto side due to pain and weakness.   Transfers Overall transfer level: Needs assistance               General transfer comment: Pt unable to get into seated position at EOB to perform transfer due to pain and weakness.  Ambulation/Gait             General Gait Details: Pt unable to get into seated position at EOB due to pain and weakness, did not amb.   Stairs            Wheelchair Mobility    Modified Rankin (Stroke Patients Only)       Balance Overall balance assessment: Needs assistance      Sitting balance - Comments: Pt unable to assume sitting position at EOB       Standing balance comment: Pt unable to assume standing position                             Pertinent Vitals/Pain Pain Assessment: 0-10 Pain Score: 4  Pain Location: abdomen and LLE Pain Descriptors / Indicators: Sore Pain Intervention(s): Limited activity within patient's tolerance;Monitored during session;Repositioned    Home Living Family/patient expects to be discharged to:: Private residence Living Arrangements: Alone Available Help at Discharge: Family Type of Home: House Home Access: Stairs to enter Entrance Stairs-Rails: Right;Left;Can reach both Technical brewer of Steps: 3 Home Layout: One level        Prior Function Level of Independence: Needs assistance         Comments: Pt is able to perform ADLs within home with a RW, requires assistance from family for all activities outside of the home.     Hand Dominance        Extremity/Trunk Assessment   Upper Extremity Assessment Upper Extremity Assessment: Generalized weakness (UE MMT grossly 4/5)    Lower Extremity Assessment Lower Extremity Assessment: Generalized weakness (LE MMT grossly 4/5)    Cervical / Trunk Assessment Cervical / Trunk Assessment: Normal  Communication   Communication: No difficulties  Cognition Arousal/Alertness: Awake/alert Behavior During  Therapy: WFL for tasks assessed/performed Overall Cognitive Status: Within Functional Limits for tasks assessed                                        General Comments      Exercises Other Exercises Other Exercises: Supine ther-ex 10x, B ankle pumps, B SLRs, B hip abd/add. Pt required cues for proper technique, esp when performing exercises on LLE.    Assessment/Plan    PT Assessment Patient needs continued PT services  PT Problem List Decreased strength;Decreased range of motion;Decreased activity tolerance;Decreased  mobility;Decreased knowledge of use of DME;Pain       PT Treatment Interventions      PT Goals (Current goals can be found in the Care Plan section)  Acute Rehab PT Goals Patient Stated Goal: to return home PT Goal Formulation: With patient Time For Goal Achievement: 07/20/17 Potential to Achieve Goals: Good    Frequency Min 2X/week   Barriers to discharge        Co-evaluation               AM-PAC PT "6 Clicks" Daily Activity  Outcome Measure Difficulty turning over in bed (including adjusting bedclothes, sheets and blankets)?: Unable Difficulty moving from lying on back to sitting on the side of the bed? : Unable Difficulty sitting down on and standing up from a chair with arms (e.g., wheelchair, bedside commode, etc,.)?: Unable Help needed moving to and from a bed to chair (including a wheelchair)?: A Lot Help needed walking in hospital room?: A Lot Help needed climbing 3-5 steps with a railing? : A Lot 6 Click Score: 9    End of Session   Activity Tolerance: Patient limited by fatigue Patient left: in bed;with call bell/phone within reach;with bed alarm set;with nursing/sitter in room;with family/visitor present   PT Visit Diagnosis: Other abnormalities of gait and mobility (R26.89);Muscle weakness (generalized) (M62.81);Pain Pain - Right/Left: Left Pain - part of body: Ankle and joints of foot (and abdomen )    Time: 1340-1405 PT Time Calculation (min) (ACUTE ONLY): 25 min   Charges:         PT G Codes:   PT G-Codes **NOT FOR INPATIENT CLASS** Functional Assessment Tool Used: AM-PAC 6 Clicks Basic Mobility Functional Limitation: Mobility: Walking and moving around Mobility: Walking and Moving Around Current Status (P5300): At least 60 percent but less than 80 percent impaired, limited or restricted Mobility: Walking and Moving Around Goal Status [phone]): At least 40 percent but less than 60 percent impaired, limited or restricted    Manfred Arch,  SPT  Manfred Arch 07/06/2017, 3:33 PM

## 2017-07-06 NOTE — ED Provider Notes (Signed)
[facility] Emergency Department Provider Note   ____________________________________________   First MD Initiated Contact with Patient 07/06/17 0405     (approximate)  I have reviewed the triage vital signs and the nursing notes.   HISTORY  Chief Complaint Abdominal Pain    HPI Jasmine Stokes is a 83 y.o. female Who comes into the hospital today with some abdominal pain. The patient called her family and stated that she fell but when the family arrived she stated she was having some right-sided abdominal pain. She reports that this started several days ago and it hurts every time she moves or walks. She reports that it sore. She's had no vomiting or diarrhea. She states though that she has not been urinating well for the last 2-3 days. The patient fell last week and has a spot on her legs where it was injured. The patient is also not been taking her Lasix. The patient lives alone. The patient rates her pain as 6-7 out of 10 in intensity. The patient was brought in today for evaluation of her symptoms.   Past Medical History:  Diagnosis Date  . Cancer ([place])   . Hypertension     Patient Active Problem List   Diagnosis Date Noted  . Essential hypertension 03/05/2016  . Closed fracture of nasal bone 03/05/2016  . Leg swelling 03/05/2016  . Sinus tachycardia 03/05/2016  . Acute posthemorrhagic anemia 03/05/2016  . History of blood transfusion 03/05/2016  . Swelling of lower extremity 03/05/2016  . Intertrochanteric fracture of right femur ([place]) 03/01/2016    Past Surgical History:  Procedure Laterality Date  . BREAST LUMPECTOMY     bilateral  . EYE SURGERY    . INTRAMEDULLARY (IM) NAIL INTERTROCHANTERIC Right 03/02/2016   Procedure: INTRAMEDULLARY (IM) NAIL INTERTROCHANTRIC;  Surgeon: Thornton Park, MD;  Location: [facility] ORS;  Service: Orthopedics;  Laterality: Right;    Prior to Admission medications   Medication Sig Start Date End Date  Taking? Authorizing Provider  ALPRAZolam (XANAX) 0.25 MG tablet Take 1-2 tablets (0.25-0.5 mg total) by mouth at bedtime as needed. 03/05/16   Theodoro Grist, MD  alum & mag hydroxide-simeth (MAALOX/MYLANTA) 200-200-20 MG/5ML suspension Take 30 mLs by mouth every 4 (four) hours as needed for indigestion. 03/05/16   Theodoro Grist, MD  anastrozole (ARIMIDEX) 1 MG tablet Take 1 tablet by mouth daily. Reported on 03/01/2016    [provider]  bisacodyl (DULCOLAX) 10 MG suppository Place 1 suppository (10 mg total) rectally daily as needed for moderate constipation. 03/05/16   Theodoro Grist, MD  docusate sodium (COLACE) 100 MG capsule Take 1 capsule (100 mg total) by mouth 2 (two) times daily. 03/05/16   Theodoro Grist, MD  enoxaparin (LOVENOX) 30 MG/0.3ML injection Inject 0.3 mLs (30 mg total) into the skin daily. 03/05/16   Theodoro Grist, MD  feeding supplement, ENSURE ENLIVE, (ENSURE ENLIVE) LIQD Take 237 mLs by mouth 2 (two) times daily between meals. 03/05/16   Theodoro Grist, MD  ferrous sulfate 325 (65 FE) MG tablet Take 1 tablet (325 mg total) by mouth 3 (three) times daily after meals. 03/05/16   Theodoro Grist, MD  ibuprofen (ADVIL,MOTRIN) 200 MG tablet Take 1 tablet by mouth every 6 (six) hours as needed. Reported on 03/01/2016    [provider]  metoprolol tartrate (LOPRESSOR) 25 MG tablet Take 0.5 tablets (12.5 mg total) by mouth 2 (two) times daily. 03/05/16   Theodoro Grist, MD  oxyCODONE (OXY IR/ROXICODONE) 5 MG immediate release  tablet Take 1-2 tablets (5-10 mg total) by mouth every 4 (four) hours as needed for breakthrough pain ((for MODERATE breakthrough pain)). 03/05/16   Theodoro Grist, MD  polyethylene glycol (MIRALAX / GLYCOLAX) packet Take 17 g by mouth daily as needed for mild constipation. 03/05/16   Theodoro Grist, MD  senna (SENOKOT) 8.6 MG TABS tablet Take 1 tablet (8.6 mg total) by mouth 2 (two) times daily. 03/05/16   Theodoro Grist, MD  Vitamin D, Ergocalciferol,  (DRISDOL) 50000 units CAPS capsule Take 1 capsule by mouth once a week. Reported on 03/01/2016    [provider]    Allergies Patient has no known allergies.  No family history on file.  Social History Social History  Substance Use Topics  . Smoking status: Never Smoker  . Smokeless tobacco: Never Used  . Alcohol use No    Review of Systems  Constitutional: No fever/chills Eyes: No visual changes. ENT: No sore throat. Cardiovascular: Denies chest pain. Respiratory: Denies shortness of breath. Gastrointestinal:  abdominal pain.  No nausea, no vomiting.  No diarrhea.  No constipation. Genitourinary: Negative for dysuria. Musculoskeletal: Negative for back pain. Skin: leg injury and redness Neurological: Negative for headaches, focal weakness or numbness.   ____________________________________________   PHYSICAL EXAM:  VITAL SIGNS: ED Triage Vitals  Enc Vitals Group     BP 07/06/17 0312 (!) 154/87     Pulse Rate 07/06/17 0312 (!) 101     Resp 07/06/17 0312 20     Temp 07/06/17 0312 97.8 F (36.6 C)     Temp Source 07/06/17 0312 Oral     SpO2 07/06/17 0312 100 %     Weight 07/06/17 0313 91 lb (41.3 kg)     Height 07/06/17 0313 5\' 2"  (1.575 m)     Head Circumference --      Peak Flow --      Pain Score 07/06/17 0312 7     Pain Loc --      Pain Edu? --      Excl. in [place]? --     Constitutional: Alert and oriented. Well appearing and in moderate distress. Eyes: Conjunctivae are normal. PERRL. EOMI. Head: Atraumatic. Nose: No congestion/rhinnorhea. Mouth/Throat: Mucous membranes are moist.  Oropharynx non-erythematous. Cardiovascular: Normal rate, regular rhythm. Grossly normal heart sounds.  Good peripheral circulation. Respiratory: Normal respiratory effort.  No retractions. Lungs CTAB. Gastrointestinal: Soft with some diffuse tenderness to palpation. distention. positive bowel sounds Musculoskeletal: bilateral lower extremity edema left greater than  right   Neurologic:  Normal speech and language.  Skin:  Skin is warm, dry and intact. Erythema to left lower extremity Psychiatric: Mood and affect are normal.   ____________________________________________   LABS (all labs ordered are listed, but only abnormal results are displayed)  Labs Reviewed  COMPREHENSIVE METABOLIC PANEL - Abnormal; Notable for the following:       Result Value   Potassium 3.2 (*)    Glucose, Bld 111 (*)    BUN 29 (*)    Total Bilirubin 1.5 (*)    GFR calc non Af Amer 47 (*)    GFR calc Af Amer 54 (*)    All other components within normal limits  URINALYSIS, COMPLETE (UACMP) WITH MICROSCOPIC - Abnormal; Notable for the following:    Color, Urine STRAW (*)    APPearance CLEAR (*)    Ketones, ur 5 (*)    Bacteria, UA RARE (*)    Squamous Epithelial / LPF 0-5 (*)  All other components within normal limits  CULTURE, BLOOD (ROUTINE X 2)  CULTURE, BLOOD (ROUTINE X 2)  LIPASE, BLOOD  CBC WITH DIFFERENTIAL/PLATELET  LACTIC ACID, PLASMA   ____________________________________________  EKG  ED ECG REPORT I, Loney Hering, the attending physician, personally viewed and interpreted this ECG.   Date: 07/06/2017  EKG Time: 637  Rate: 91  Rhythm: normal sinus rhythm  Axis: normal  Intervals:none  ST&T Change: normal  ____________________________________________  RADIOLOGY  Ct Abdomen Pelvis W Contrast  Result Date: 07/06/2017 CLINICAL DATA:  Right-sided abdominal pain. Abdominal distension. Fall last week. EXAM: CT ABDOMEN AND PELVIS WITH CONTRAST TECHNIQUE: Multidetector CT imaging of the abdomen and pelvis was performed using the standard protocol following bolus administration of intravenous contrast. CONTRAST:  38mL ISOVUE-300 IOPAMIDOL (ISOVUE-300) INJECTION 61% COMPARISON:  PET-CT 04/04/2011 FINDINGS: Lower chest: Motion artifact. Mild basilar atelectasis. Heart size upper normal. There are coronary artery calcifications. Hepatobiliary:  Multiple gallstones including a 12 mm stone in the gallbladder neck. There is mild intrahepatic biliary ductal dilatation, the common bile duct measures 8 mm, may be normal for age. No calcified choledocholithiasis. No focal hepatic lesion. Pancreas: No ductal dilatation or inflammation. Spleen: Normal in size without focal abnormality. Splenule anteriorly. Adrenals/Urinary Tract: Mild left adrenal thickening. Right adrenal gland is normal. No hydronephrosis or perinephric edema. Small low-density lesions within both kidneys are incompletely characterized due to size but likely small cysts. Urinary bladder is distended. No bladder wall thickening. Stomach/Bowel: Lack of enteric contrast and paucity of intra-abdominal fat limits bowel assessment. Stomach is nondistended. No evidence of bowel inflammation or wall thickening. Fecalization of small bowel contents suggesting slow transit. Few prominent air-filled pelvic small bowel loops. Colonic tortuosity with moderate diffuse colonic stool burden. Appendix not confidently visualized. Vascular/Lymphatic: Mild aortic atherosclerosis without aneurysm. No adenopathy. Reproductive: Atrophic uterus, normal for age. 2.7 cm left adnexal structure is unchanged from 2012 examine presumed benign. Other: No intra-abdominal free fluid or free air. Musculoskeletal: Compression fractures at T11 and L1, likely chronic. Bones are diffusely under mineralized. Postsurgical change in the right proximal femur. No acute fracture. IMPRESSION: 1. Cholelithiasis. Intrahepatic biliary ductal dilatation with prominent common bile duct, no definite choledocholithiasis. Given right-sided pain, recommend right upper quadrant ultrasound. 2. Moderate colonic stool burden with colonic tortuosity and fecalization of small bowel contents suggesting slow transit. Few prominent air-filled small bowel loops in the lower abdomen, likely mild ileus. 3. Compression fractures of T11 and L1, technically age  indeterminate but likely chronic. 4. Aortic Atherosclerosis (ICD10-I70.0). Coronary artery calcifications. Electronically Signed   By: Jeb Levering M.D.   On: 07/06/2017 05:44    ____________________________________________   PROCEDURES  Procedure(s) performed: None  Procedures  Critical Care performed: No  ____________________________________________   INITIAL IMPRESSION / ASSESSMENT AND PLAN / ED COURSE  Pertinent labs & imaging results that were available during my care of the patient were reviewed by me and considered in my medical decision making (see chart for details).  This is a 83 year old who comes into the hospital today with some abdominal pain. The patient initially said it was her right side but then she was having pain all over her abdomen. I did order some blood work which was unremarkable. The patient appears to have a cellulitis to her left lower extremity. As the patient for a CT scan which showed some cholelithiasis with some bile duct dilatation. She also has some stool in her colon and a possible ileus. I will send the patient for  an ultrasound of her gallbladder. She did receive 2 doses of fentanyl.     The patient's care will be signed out to Dr. Corky Downs who will follow up the results and disposition the patient. The patient will receive a dose of clindamycin for her cellulitis.  ____________________________________________   FINAL CLINICAL IMPRESSION(S) / ED DIAGNOSES  Final diagnoses:  Abdominal pain  Cellulitis of left lower extremity      NEW MEDICATIONS STARTED DURING THIS VISIT:  New Prescriptions   No medications on file     Note:  This document was prepared using Dragon voice recognition software and may include unintentional dictation errors.    Loney Hering, MD 07/06/17 [phone]

## 2017-07-06 NOTE — ED Provider Notes (Signed)
[place] with large gall stones but no evidence of cholecystitis. Enlarged CBD, unclear cause. Will admit to hospitalist service for further management. D/w patient and family.    Lavonia Drafts, MD 07/06/17 [phone]

## 2017-07-07 DIAGNOSIS — K5900 Constipation, unspecified: Secondary | ICD-10-CM | POA: Diagnosis not present

## 2017-07-07 DIAGNOSIS — R609 Edema, unspecified: Secondary | ICD-10-CM | POA: Diagnosis not present

## 2017-07-07 DIAGNOSIS — L8990 Pressure ulcer of unspecified site, unspecified stage: Secondary | ICD-10-CM | POA: Insufficient documentation

## 2017-07-07 LAB — COMPREHENSIVE METABOLIC PANEL
ALBUMIN: 3.4 g/dL — AB (ref 3.5–5.0)
ALK PHOS: 86 U/L (ref 38–126)
ALT: 20 U/L (ref 14–54)
ANION GAP: 9 (ref 5–15)
AST: 34 U/L (ref 15–41)
BILIRUBIN TOTAL: 1.6 mg/dL — AB (ref 0.3–1.2)
BUN: 21 mg/dL — ABNORMAL HIGH (ref 6–20)
CO2: 27 mmol/L (ref 22–32)
Calcium: 8.5 mg/dL — ABNORMAL LOW (ref 8.9–10.3)
Chloride: 104 mmol/L (ref 101–111)
Creatinine, Ser: 0.92 mg/dL (ref 0.44–1.00)
GFR calc Af Amer: 59 mL/min — ABNORMAL LOW (ref >60)
GFR calc non Af Amer: 51 mL/min — ABNORMAL LOW (ref >60)
GLUCOSE: 88 mg/dL (ref 65–99)
Potassium: 3.7 mmol/L (ref 3.5–5.1)
SODIUM: 140 mmol/L (ref 135–145)
Total Protein: 5.7 g/dL — ABNORMAL LOW (ref 6.5–8.1)

## 2017-07-07 LAB — CBC
HEMATOCRIT: 34.5 % — AB (ref 35.0–47.0)
HEMOGLOBIN: 11.9 g/dL — AB (ref 12.0–16.0)
MCH: 29 pg (ref 26.0–34.0)
MCHC: 34.4 g/dL (ref 32.0–36.0)
MCV: 84.3 fL (ref 80.0–100.0)
Platelets: 214 10*3/uL (ref 150–440)
RBC: 4.09 MIL/uL (ref 3.80–5.20)
RDW: 14.3 % (ref 11.5–14.5)
WBC: 6.9 10*3/uL (ref 3.6–11.0)

## 2017-07-07 MED ORDER — ANASTROZOLE 1 MG PO TABS
1.0000 mg | ORAL_TABLET | Freq: Every day | ORAL | Status: DC
  Administered 2017-07-07: 1 mg via ORAL
  Filled 2017-07-07: qty 1

## 2017-07-07 MED ORDER — CLINDAMYCIN HCL 150 MG PO CAPS
300.0000 mg | ORAL_CAPSULE | Freq: Three times a day (TID) | ORAL | Status: DC
  Administered 2017-07-07: 300 mg via ORAL
  Filled 2017-07-07 (×2): qty 2

## 2017-07-07 MED ORDER — MUPIROCIN CALCIUM 2 % EX CREA: TOPICAL_CREAM | Freq: Two times a day (BID) | CUTANEOUS | 0 refills | Status: AC

## 2017-07-07 MED ORDER — ALPRAZOLAM 0.25 MG PO TABS: 0.2500 mg | ORAL_TABLET | Freq: Every evening | ORAL | 0 refills | Status: AC | PRN

## 2017-07-07 MED ORDER — POLYETHYLENE GLYCOL 3350 17 G PO PACK: 17.0000 g | PACK | Freq: Every day | ORAL | Status: DC | PRN

## 2017-07-07 MED ORDER — AMLODIPINE BESYLATE 5 MG PO TABS
2.5000 mg | ORAL_TABLET | Freq: Every day | ORAL | Status: DC
  Administered 2017-07-07: 2.5 mg via ORAL
  Filled 2017-07-07: qty 1

## 2017-07-07 MED ORDER — METOPROLOL TARTRATE 25 MG PO TABS
12.5000 mg | ORAL_TABLET | Freq: Two times a day (BID) | ORAL | Status: DC
  Administered 2017-07-07: 12.5 mg via ORAL
  Filled 2017-07-07: qty 1

## 2017-07-07 MED ORDER — CLINDAMYCIN HCL 300 MG PO CAPS: 300.0000 mg | ORAL_CAPSULE | Freq: Three times a day (TID) | ORAL | 0 refills | Status: AC

## 2017-07-07 MED ORDER — MUPIROCIN CALCIUM 2 % EX CREA
TOPICAL_CREAM | Freq: Two times a day (BID) | CUTANEOUS | Status: DC
  Administered 2017-07-07: 13:00:00 via TOPICAL
  Filled 2017-07-07: qty 15

## 2017-07-07 MED ORDER — FUROSEMIDE 20 MG PO TABS
20.0000 mg | ORAL_TABLET | ORAL | Status: DC
  Administered 2017-07-07: 20 mg via ORAL
  Filled 2017-07-07: qty 1

## 2017-07-07 NOTE — Clinical Social Work Placement (Signed)
   CLINICAL SOCIAL WORK PLACEMENT  NOTE  Date:  07/07/2017  Patient Details  Name: Jasmine Stokes MRN: [id number] Date of Birth: [birth year]  Clinical Social Work is seeking post-discharge placement for this patient at the [place] level of care (*CSW will initial, date and re-position this form in  chart as items are completed):  Yes   Patient/family provided with [place] Work Department's list of facilities offering this level of care within the geographic area requested by the patient (or if unable, by the patient's family).  Yes   Patient/family informed of their freedom to choose among providers that offer the needed level of care, that participate in Medicare, Medicaid or managed care program needed by the patient, have an available bed and are willing to accept the patient.  Yes   Patient/family informed of [place]'s ownership interest in [facility] and [facility], as well as of the fact that they are under no obligation to receive care at these facilities.  PASRR submitted to EDS on       PASRR number received on       Existing PASRR number confirmed on 07/07/17     FL2 transmitted to all facilities in geographic area requested by pt/family on 07/07/17     FL2 transmitted to all facilities within larger geographic area on       Patient informed that his/her managed care company has contracts with or will negotiate with certain facilities, including the following:            Patient/family informed of bed offers received.  Patient chooses bed at       Physician recommends and patient chooses bed at      Patient to be transferred to   on  .  Patient to be transferred to facility by       Patient family notified on   of transfer.  Name of family member notified:        PHYSICIAN Please sign FL2, Please sign DNR     Additional Comment:    _______________________________________________ Lilly Cove,  LCSW 07/07/2017, 9:34 AM

## 2017-07-07 NOTE — Care Management (Signed)
Spoke with patient's niece, Deneise Lever [phone]). Updated her on patients discharge today to [facility]. She is in agreement with POC. Informed her that the discharge would be this afternoon.CSW updated.

## 2017-07-07 NOTE — Progress Notes (Signed)
Attempted to call report to [facility] x2.

## 2017-07-07 NOTE — Clinical Social Work Placement (Addendum)
Patient will be discharged today. Patient going to [place] and can admit after 12:00 Report:  [phone] Patient will transport by EMS  [name], MSW Clinical Social Work: System Wide Float Coverage for :  [phone]   CLINICAL SOCIAL WORK PLACEMENT  NOTE  Date:  07/07/2017  Patient Details  Name: Jasmine Stokes MRN: [id number] Date of Birth: 14-Jul-1919  Clinical Social Work is seeking post-discharge placement for this patient at the [place] level of care (*CSW will initial, date and re-position this form in  chart as items are completed):  Yes   Patient/family provided with [place] Work Department's list of facilities offering this level of care within the geographic area requested by the patient (or if unable, by the patient's family).  Yes   Patient/family informed of their freedom to choose among providers that offer the needed level of care, that participate in Medicare, Medicaid or managed care program needed by the patient, have an available bed and are willing to accept the patient.  Yes   Patient/family informed of [place]'s ownership interest in [facility] and [facility], as well as of the fact that they are under no obligation to receive care at these facilities.  PASRR submitted to EDS on       PASRR number received on       Existing PASRR number confirmed on 07/07/17     FL2 transmitted to all facilities in geographic area requested by pt/family on 07/07/17     FL2 transmitted to all facilities within larger geographic area on       Patient informed that his/her managed care company has contracts with or will negotiate with certain facilities, including the following:            Patient/family informed of bed offers received.  07/07/2017    Patient chooses bed at      [facility]  Physician recommends and patient chooses bed at     [facility] Patient to be transferred to   on  .  07/07/2017   Patient to be transferred to facility by     EMS  Patient family notified on   of transfer.  Niece and patient  Name of family member notified:        PHYSICIAN Please sign FL2, Please sign DNR     Additional Comment:    _______________________________________________ Lilly Cove, LCSW 07/07/2017, 11:46 AM

## 2017-07-07 NOTE — Care Management CC44 (Signed)
Condition Code 44 Documentation Completed  Patient Details  Name: Jasmine Stokes MRN: [id number] Date of Birth: 01/06/1919   Condition Code 44 given:  Yes Patient signature on Condition Code 44 notice:  Yes Documentation of 2 MD's agreement:  Yes Code 44 added to claim:  Yes    Jolly Mango, RN 07/07/2017, 10:44 AM

## 2017-07-07 NOTE — Progress Notes (Signed)
Unable to make follow-up appt due to [facility] being in a meeting

## 2017-07-07 NOTE — NC FL2 (Signed)
[place] LEVEL OF CARE SCREENING TOOL     IDENTIFICATION  Patient Name: Jasmine Stokes Birthdate: 1919/03/01 Sex: female Admission Date (Current Location): 07/06/2017  [place] and [place] Number:  Engineering geologist and Address:  [facility], [address], [place], [place] [address]      Provider Number: [id number]  Attending Physician Name and Address:  Bettey Costa, MD  Relative Name and Phone Number:       Current Level of Care: Hospital Recommended Level of Care: [place] Prior Approval Number:    Date Approved/Denied:   PASRR Number: [id number]  Discharge Plan: SNF    Current Diagnoses: Patient Active Problem List   Diagnosis Date Noted  . Pressure injury of skin 07/07/2017  . Abdominal pain 07/06/2017  . Essential hypertension 03/05/2016  . Closed fracture of nasal bone 03/05/2016  . Leg swelling 03/05/2016  . Sinus tachycardia 03/05/2016  . Acute posthemorrhagic anemia 03/05/2016  . History of blood transfusion 03/05/2016  . Swelling of lower extremity 03/05/2016  . Intertrochanteric fracture of right femur ([place]) 03/01/2016    Orientation RESPIRATION BLADDER Height & Weight     Self, Time, Situation, Place  Normal Incontinent Weight: 88 lb 11.2 oz (40.2 kg) Height:  5\' 2"  (157.5 cm)  BEHAVIORAL SYMPTOMS/MOOD NEUROLOGICAL BOWEL NUTRITION STATUS      Incontinent Diet (soft diet)  AMBULATORY STATUS COMMUNICATION OF NEEDS Skin   Extensive Assist Verbally PU Stage and Appropriate Care (Stage II -  Partial thickness loss of dermis presenting as a shallow open ulcer with a red, pink wound bed without slough) [place]    PU Stage 2 Dressing: BID   Stage II -Partial thickness loss of dermis presenting as a shallow open ulcer with a red, pink wound bed without slough  (left leg cellulitis, open wound)                   Personal Care Assistance Level of Assistance  Bathing, Feeding, Dressing  Bathing Assistance: Maximum assistance Feeding assistance: Limited assistance Dressing Assistance: Maximum assistance     Functional Limitations Info  Sight, Hearing, Speech Sight Info: Adequate Hearing Info: Impaired Speech Info: Adequate    SPECIAL CARE FACTORS FREQUENCY  PT (By licensed PT), OT (By licensed OT)     PT Frequency: 5x OT Frequency: 5x            Contractures Contractures Info: Not present    Additional Factors Info  Code Status, Allergies Code Status Info: DNR Allergies Info: NKA           Current Medications (07/07/2017):  This is the current hospital active medication list Current Facility-Administered Medications  Medication Dose Route Frequency Provider Last Rate Last Dose  . 0.9 % NaCl with KCl 20 mEq/ L  infusion   Intravenous Continuous Bettey Costa, MD 75 mL/hr at 07/07/17 0521    . acetaminophen (TYLENOL) tablet 650 mg  650 mg Oral Q6H PRN Bettey Costa, MD       Or  . acetaminophen (TYLENOL) suppository 650 mg  650 mg Rectal Q6H PRN Mody, Sital, MD      . amLODipine (NORVASC) tablet 2.5 mg  2.5 mg Oral Daily Mody, Sital, MD      . anastrozole (ARIMIDEX) tablet 1 mg  1 mg Oral Daily Mody, Sital, MD      . clindamycin (CLEOCIN) capsule 300 mg  300 mg Oral Q8H Bettey Costa, MD      .  docusate sodium (COLACE) capsule 100 mg  100 mg Oral BID Bettey Costa, MD   100 mg at 07/06/17 2210  . furosemide (LASIX) tablet 20 mg  20 mg Oral QODAY Mody, Sital, MD      . heparin injection 5,000 Units  5,000 Units Subcutaneous Q8H Bettey Costa, MD   5,000 Units at 07/06/17 2210  . hydrALAZINE (APRESOLINE) injection 10 mg  10 mg Intravenous Q6H PRN Bettey Costa, MD      . HYDROcodone-acetaminophen (NORCO/VICODIN) 5-325 MG per tablet 1-2 tablet  1-2 tablet Oral Q4H PRN Mody, Sital, MD      . Influenza vac split quadrivalent PF (FLUZONE HIGH-DOSE) injection 0.5 mL  0.5 mL Intramuscular Tomorrow-1000 Mody, Sital, MD      . ketorolac (TORADOL) 15 MG/ML injection 15 mg  15  mg Intravenous Q6H PRN Bettey Costa, MD   15 mg at 07/06/17 1225  . metoprolol tartrate (LOPRESSOR) tablet 12.5 mg  12.5 mg Oral BID Mody, Sital, MD      . polyethylene glycol (MIRALAX / GLYCOLAX) packet 17 g  17 g Oral Daily PRN Mody, Sital, MD      . polyethylene glycol (MIRALAX / GLYCOLAX) packet 17 g  17 g Oral Daily PRN Bettey Costa, MD      . senna (SENOKOT) tablet 8.6 mg  1 tablet Oral BID Bettey Costa, MD   8.6 mg at 07/06/17 2210  . sodium phosphate (FLEET) 7-19 GM/118ML enema 1 enema  1 enema Rectal Once Bettey Costa, MD         Discharge Medications: Please see discharge summary for a list of discharge medications.  Relevant Imaging Results:  Relevant Lab Results:   Additional Information SSN:  [id number]  Lilly Cove, [place]

## 2017-07-07 NOTE — Clinical Social Work Note (Signed)
Clinical Social Work Assessment  Patient Details  Name: CAMMI CONSALVO MRN: [id number] Date of Birth: 1919/03/27  Date of referral:  07/07/17               Reason for consult:  Facility Placement, Discharge Planning                Permission sought to share information with:  Case Manager, Customer service manager Permission granted to share information::  Yes, Verbal Permission Granted  Name::        Agency::  SNF [facility] referrals  Relationship::     Contact Information:     Housing/Transportation Living arrangements for the past 2 months:  Single Family Home Source of Information:  Patient, Medical Team, Case Manager, Facility Patient Interpreter Needed:  None Criminal Activity/Legal Involvement Pertinent to Current Situation/Hospitalization:  No - Comment as needed Significant Relationships:  Neighbor, [employer], Friend Lives with:  Self Do you feel safe going back to the place where you live?  No Need for family participation in patient care:  No (Coment)  Care giving concerns:  Patient admitted for  Edema, osteomyelitis, and cellulitis of the left lower leg.  Patient prior to admission was at home alone and reports she is never without company as she has church family come and visit her and friends. Patient reports she uses a walker at home but currently she is very weak and unable to manage safely by returning home. Patient is agreeable to recommendation for SNF placement.  Patient familiar with process, placed in 2017 at [facility].  Agreeable to return to [facility] at this time.     Social Worker assessment / plan:  Assessment completed for consult: SNF placement, DC planning. Patient seen at bedside.  Alert and oriented and agreeable to plan after discussing discharge plans options. Patient gave permissions to seek SNF bed, prefers [facility] if possible. Plan is for DC today if bed found. Patient reports not immediate family members, but reports her Jeanella Anton came by to see her this morning.   Plan: SNF today. SNF work up completed. Will follow up with [facility] and other offers once reviewed and available. CM aware of discharge plan. Patient will require EMS at DC.  Employment status:  Retired Nurse, adult PT Recommendations:  [place] / Referral to community resources:  [place]  Patient/Family's Response to care:  Agreeable  Patient/Family's Understanding of and Emotional Response to Diagnosis, Current Treatment, and Prognosis:  Patient able to verbalize her problems at home and has been talking with medical team about SNF at Discharge. She is agreeable and able to define her plan.  Emotional Assessment Appearance:  Appears stated age Attitude/Demeanor/Rapport:    Affect (typically observed):  Accepting, Adaptable, Pleasant Orientation:  Oriented to Self, Oriented to Place, Oriented to  Time, Oriented to Situation Alcohol / Substance use:  Not Applicable Psych involvement (Current and /or in the community):  No (Comment)  Discharge Needs  Concerns to be addressed:  No discharge needs identified Readmission within the last 30 days:  No Current discharge risk:  None Barriers to Discharge:  No Barriers Identified   Lilly [place], LCSW 07/07/2017, 9:36 AM

## 2017-07-07 NOTE — Care Management Obs Status (Signed)
[place] NOTIFICATION   Patient Details  Name: Jasmine Stokes MRN: [id number] Date of Birth: 1918-11-18   Medicare Observation Status Notification Given:  Yes    Jolly Mango, RN 07/07/2017, 10:44 AM

## 2017-07-07 NOTE — Progress Notes (Signed)
Left name and number with [facility] to call back for report. EMS called for patient and all belongings packed up. VSS. PIV removed.

## 2017-07-07 NOTE — Progress Notes (Signed)
EMS arrived to transport patient to [facility].

## 2017-07-07 NOTE — Progress Notes (Signed)
[place] [facility] [place] NAME: Jasmine Stokes    MR#:  [id number]  DATE OF BIRTH:  [birth year]  SUBJECTIVE:   Abdominal pain has subsided after bowel movement No acute events overnight  REVIEW OF SYSTEMS:    Review of Systems  Constitutional: Negative for fever, chills weight loss HENT: Negative for ear pain, nosebleeds, congestion, facial swelling, rhinorrhea, neck pain, neck stiffness and ear discharge.   Respiratory: Negative for cough, shortness of breath, wheezing  Cardiovascular: Negative for chest pain, palpitations and leg swelling.  Gastrointestinal: Negative for heartburn, abdominal pain, vomiting, diarrhea or consitpation Genitourinary: Negative for dysuria, urgency, frequency, hematuria Musculoskeletal: Negative for back pain or joint pain Neurological: Negative for dizziness, seizures, syncope, focal weakness,  numbness and headaches.  Hematological: Does not bruise/bleed easily.  Psychiatric/Behavioral: Negative for hallucinations, confusion, dysphoric mood Skin: Leg cellulitis   Tolerating Diet: yes      DRUG ALLERGIES:  No Known Allergies  VITALS:  Blood pressure (!) 130/54, pulse 82, temperature 98.5 F (36.9 C), temperature source Oral, resp. rate 12, height 5\' 2"  (1.575 m), weight 40.2 kg (88 lb 11.2 oz), SpO2 93 %.  PHYSICAL EXAMINATION:  Constitutional: Appears well-developed and well-nourished. No distress. HENT: Normocephalic. Marland Kitchen Oropharynx is clear and moist.  Eyes: Conjunctivae and EOM are normal. PERRLA, no scleral icterus.  Neck: Normal ROM. Neck supple. No JVD. No tracheal deviation. CVS: RRR, S1/S2 +, no murmurs, no gallops, no carotid bruit.  Pulmonary: Effort and breath sounds normal, no stridor, rhonchi, wheezes, rales.  Abdominal: Soft. BS +,  no distension, tenderness, rebound or guarding.  Musculoskeletal: Normal range of motion. No edema and no tenderness.  Neuro: Alert. CN 2-12 grossly intact. No  focal deficits. Skin: Left leg with erythema on shin there is a open wound not drainage Psychiatric: Normal mood and affect.      LABORATORY PANEL:   CBC  Recent Labs Lab 07/07/17 0319  WBC 6.9  HGB 11.9*  HCT 34.5*  PLT 214   ------------------------------------------------------------------------------------------------------------------  Chemistries   Recent Labs Lab 07/06/17 0316 07/07/17 0319  NA 139 140  K 3.2* 3.7  CL 101 104  CO2 27 27  GLUCOSE 111* 88  BUN 29* 21*  CREATININE 0.99 0.92  CALCIUM 9.3 8.5*  MG 1.9  --   AST 37 34  ALT 26 20  ALKPHOS 109 86  BILITOT 1.5* 1.6*   ------------------------------------------------------------------------------------------------------------------  Cardiac Enzymes No results for input(s): TROPONINI in the last 168 hours. ------------------------------------------------------------------------------------------------------------------  RADIOLOGY:  Ct Abdomen Pelvis W Contrast  Result Date: 07/06/2017 CLINICAL DATA:  Right-sided abdominal pain. Abdominal distension. Fall last week. EXAM: CT ABDOMEN AND PELVIS WITH CONTRAST TECHNIQUE: Multidetector CT imaging of the abdomen and pelvis was performed using the standard protocol following bolus administration of intravenous contrast. CONTRAST:  7mL ISOVUE-300 IOPAMIDOL (ISOVUE-300) INJECTION 61% COMPARISON:  PET-CT 04/04/2011 FINDINGS: Lower chest: Motion artifact. Mild basilar atelectasis. Heart size upper normal. There are coronary artery calcifications. Hepatobiliary: Multiple gallstones including a 12 mm stone in the gallbladder neck. There is mild intrahepatic biliary ductal dilatation, the common bile duct measures 8 mm, may be normal for age. No calcified choledocholithiasis. No focal hepatic lesion. Pancreas: No ductal dilatation or inflammation. Spleen: Normal in size without focal abnormality. Splenule anteriorly. Adrenals/Urinary Tract: Mild left adrenal  thickening. Right adrenal gland is normal. No hydronephrosis or perinephric edema. Small low-density lesions within both kidneys are incompletely characterized due to size but likely small cysts. Urinary bladder  is distended. No bladder wall thickening. Stomach/Bowel: Lack of enteric contrast and paucity of intra-abdominal fat limits bowel assessment. Stomach is nondistended. No evidence of bowel inflammation or wall thickening. Fecalization of small bowel contents suggesting slow transit. Few prominent air-filled pelvic small bowel loops. Colonic tortuosity with moderate diffuse colonic stool burden. Appendix not confidently visualized. Vascular/Lymphatic: Mild aortic atherosclerosis without aneurysm. No adenopathy. Reproductive: Atrophic uterus, normal for age. 2.7 cm left adnexal structure is unchanged from 2012 examine presumed benign. Other: No intra-abdominal free fluid or free air. Musculoskeletal: Compression fractures at T11 and L1, likely chronic. Bones are diffusely under mineralized. Postsurgical change in the right proximal femur. No acute fracture. IMPRESSION: 1. Cholelithiasis. Intrahepatic biliary ductal dilatation with prominent common bile duct, no definite choledocholithiasis. Given right-sided pain, recommend right upper quadrant ultrasound. 2. Moderate colonic stool burden with colonic tortuosity and fecalization of small bowel contents suggesting slow transit. Few prominent air-filled small bowel loops in the lower abdomen, likely mild ileus. 3. Compression fractures of T11 and L1, technically age indeterminate but likely chronic. 4. Aortic Atherosclerosis (ICD10-I70.0). Coronary artery calcifications. Electronically Signed   By: Jeb Levering M.D.   On: 07/06/2017 05:44   US Venous Img Lower Unilateral Left  Result Date: 07/06/2017 CLINICAL DATA:  Edema. EXAM: LEFT LOWER EXTREMITY VENOUS DOPPLER ULTRASOUND TECHNIQUE: Gray-scale sonography with graded compression, as well as color  Doppler and duplex ultrasound were performed to evaluate the lower extremity deep venous systems from the level of the common femoral vein and including the common femoral, femoral, profunda femoral, popliteal and calf veins including the posterior tibial, peroneal and gastrocnemius veins when visible. The superficial great saphenous vein was also interrogated. Spectral Doppler was utilized to evaluate flow at rest and with distal augmentation maneuvers in the common femoral, femoral and popliteal veins. COMPARISON:  None. FINDINGS: Contralateral Common Femoral Vein: Respiratory phasicity is normal and symmetric with the symptomatic side. No evidence of thrombus. Normal compressibility. Common Femoral Vein: No evidence of thrombus. Normal compressibility, respiratory phasicity and response to augmentation. Saphenofemoral Junction: No evidence of thrombus. Normal compressibility and flow on color Doppler imaging. Profunda Femoral Vein: No evidence of thrombus. Normal compressibility and flow on color Doppler imaging. Femoral Vein: No evidence of thrombus. Normal compressibility, respiratory phasicity and response to augmentation. Popliteal Vein: No evidence of thrombus. Normal compressibility, respiratory phasicity and response to augmentation. Calf Veins: No evidence of thrombus. Normal compressibility and flow on color Doppler imaging. Superficial Great Saphenous Vein: No evidence of thrombus. Normal compressibility and flow on color Doppler imaging. Venous Reflux:  None. Other Findings:  None. IMPRESSION: No evidence of DVT within the left lower extremity. Soft tissue edema. Electronically Signed   By: Dorise Bullion III M.D   On: 07/06/2017 09:37   Dg Tibia/fibula Left Port  Result Date: 07/06/2017 CLINICAL DATA:  Fall last week.  Pain. EXAM: PORTABLE LEFT TIBIA AND FIBULA - 2 VIEW COMPARISON:  None. FINDINGS: Diffuse soft tissue edema. No fractures identified. No bony erosions. No other acute abnormalities.  IMPRESSION: Soft tissue swelling.  No underlying bony abnormality. Electronically Signed   By: Dorise Bullion III M.D   On: 07/06/2017 09:36   Mr Abdomen With Mrcp W Contrast  Result Date: 07/06/2017 CLINICAL DATA:  Cholelithiasis.  Abdominal pain starting 07/05/2017 EXAM: MRI ABDOMEN WITHOUT CONTRAST  (INCLUDING MRCP) TECHNIQUE: Multiplanar multisequence MR imaging of the abdomen was performed. Heavily T2-weighted images of the biliary and pancreatic ducts were obtained, and three-dimensional MRCP images were rendered by post  processing. COMPARISON:  Multiple exams, including CT and ultrasound exams from 07/06/2017 FINDINGS: Despite efforts by the technologist and patient, motion artifact is present on today's exam and could not be eliminated. This reduces exam sensitivity and specificity. Lower chest: Trace bilateral pleural effusions. Hepatobiliary: Two visualized gallstones in the gallbladder, the larger measuring 2.1 cm in long axis. Common bile duct caliber is estimated at 8 mm, which is within normal limits for the patient's age. I do not see a definite distal CBD stone although the dedicated MR CTs are essentially nondiagnostic due to motion artifact and other sequences are severely degraded. There is only borderline intrahepatic biliary dilatation. No focal abnormal hepatic lesion identified. I do not see definite abnormal enhancement along the biliary tree. Pancreas: No significant pancreatic duct dilatation. No obvious pancreatic lesion although the pancreas is obscured by motion artifact on most sequences. Spleen:  Grossly unremarkable Adrenals/Urinary Tract: Suspected small right kidney upper pole cyst on the T2 weighted images. Adrenal glands normal. Stomach/Bowel: Unremarkable where seen in the upper abdomen. Vascular/Lymphatic: Aortoiliac atherosclerotic vascular disease. Patent celiac and superior mesenteric artery. No pathologic adenopathy identified. Other:  No supplemental non-categorized  findings. Musculoskeletal: Compression fractures of T11 and L1. IMPRESSION: 1. Severe degradation of images due to motion artifact. In general, successful MRCP requires patient capable of breath holds and remaining motionless. Markedly reduced sensitivity and specificity. 2. We do visualize 2 gallstones in the gallbladder. Common bile duct caliber at 8 mm is within normal limits given the patient's age of 38 years. There is only borderline intrahepatic biliary dilatation. I do not see an obvious filling defect in the common bile duct, subject to limitations from severe motion artifact making the MRCP sequences nondiagnostic, and relying on other sequences to fill in the information. 3. Trace bilateral pleural effusions. 4.  Aortic Atherosclerosis (ICD10-I70.0). 5. Compression fractures at T11 and L1. Electronically Signed   By: Van Clines M.D.   On: 07/06/2017 16:36   US Abdomen Limited Ruq  Result Date: 07/06/2017 CLINICAL DATA:  Abdominal pain for 1 day. EXAM: ULTRASOUND ABDOMEN LIMITED RIGHT UPPER QUADRANT COMPARISON:  CT scan from earlier today FINDINGS: Gallbladder: 2 stones are seen in the gallbladder with a 20 mm stone in the fundus and a non mobile 15 mm stone in the neck. No gallbladder wall thickening, pericholecystic fluid, or Murphy's sign. Common bile duct: Diameter: 10 mm Liver: Mild intrahepatic ductal dilatation. No focal mass. Portal vein is patent on color Doppler imaging with normal direction of blood flow towards the liver. IMPRESSION: 1. Two large stones are seen in the gallbladder with a 20 mm stone in the fundus and a 15 mm non mobile stone in the neck. However, no wall thickening, pericholecystic fluid, or Murphy's sign are identified. 2. The common bile duct measures 10 mm and there is central intrahepatic ductal dilatation. Findings are concerning for central obstruction. An MRCP could better evaluate. Electronically Signed   By: Dorise Bullion III M.D   On: 07/06/2017 07:44      ASSESSMENT AND PLAN:   83 year old female with history of hypertension who presents with abdominal pain and left lower extremity cellulitis.  1. Abdominal pain: Patient's abdominal pain actually subsided after bowel movement. There was concern the patient may have acute cholecystitis. After reviewing MRCP does not appear the patient has acute cholecystitis. GI and surgery evaluations are appreciated.  2. Left lower extremity cellulitis: X-ray did not show evidence of osteomyelitis. Lower extremity Doppler did not show evidence of DVT.  She needs to elevate the leg. Continue wound care. Continue antibiotics. I have changed Zosyn to clindamycin.   3. Essential hypertension:Patient will continue metoprolol, Norvasc and Lasix 4. Hypokalemia: This was repleted and has normalized  5. History of breast cancer: Continue Arimidex  Management plans discussed with the patient and she is in agreement.  CODE STATUS: DNR  TOTAL TIME TAKING CARE OF THIS PATIENT: 30 minutes.   Physical therapy is recommended skilled nursing facility at discharge.  POSSIBLE D/C today, DEPENDING ON CLINICAL CONDITION.   Mariabelen Pressly M.D on 07/07/2017 at 8:59 AM  Between 7am to 6pm - Pager - [phone] After 6pm go to [email or web address] - password [email or web address] [facility]  Office  [phone]  CC: Primary care physician; Madelyn Brunner, MD  Note: This dictation was prepared with Dragon dictation along with smaller phrase technology. Any transcriptional errors that result from this process are unintentional.

## 2017-07-07 NOTE — Consult Note (Signed)
[place] Nurse wound consult note Reason for Consult: Full thickness injury to left lower anterior leg, present on admission. Patient intermittently confused but states she bumped into something.  Resulting cellulitis Wound type:trauma and infectious Pressure Injury POA: NA Measurement: 3 cm x 2 cm x 0.2 cm scabbed and dry Wound MBP:[id number] and dry Drainage (amount, consistency, odor) none noted Periwound:erythema and tenderness.  Medicated for pain per patient request Dressing procedure/placement/frequency:Cleanse wound to left lower leg with Ns and pat dry.  Apply Mupirocin ointment twice daily.  Cover with dry dressing.  Will not follow at this time.  Please re-consult if needed.  Domenic Moras RN BSN [place] Pager [phone]

## 2017-07-07 NOTE — Progress Notes (Signed)
[place] NOTE  Pharmacy Consult for Electrolyte Replacement Indication: hypokalemia  No Known Allergies   Labs:  Recent Labs  07/06/17 0316 07/07/17 0319  WBC 6.5 6.9  HGB 12.9 11.9*  HCT 37.7 34.5*  PLT 238 214  CREATININE 0.99 0.92  MG 1.9  --   ALBUMIN 4.0 3.4*  PROT 6.9 5.7*  AST 37 34  ALT 26 20  ALKPHOS 109 86  BILITOT 1.5* 1.6*   Lab Results  Component Value Date   K 3.7 07/07/2017   Estimated Creatinine Clearance: 22.7 mL/min (by C-G formula based on SCr of 0.92 mg/dL).  Medical History: Past Medical History:  Diagnosis Date  . Cancer ([place])   . Hypertension     Medications:  Scheduled:  . amLODipine  2.5 mg Oral Daily  . anastrozole  1 mg Oral Daily  . clindamycin  300 mg Oral Q8H  . docusate sodium  100 mg Oral BID  . furosemide  20 mg Oral QODAY  . heparin subcutaneous  5,000 Units Subcutaneous Q8H  . Influenza vac split quadrivalent PF  0.5 mL Intramuscular Tomorrow-1000  . metoprolol tartrate  12.5 mg Oral BID  . mupirocin cream   Topical BID  . senna  1 tablet Oral BID  . sodium phosphate  1 enema Rectal Once   Infusions:  . 0.9 % NaCl with KCl 20 mEq / L 75 mL/hr at 07/07/17 1962    Assessment: 83 yo F with hypokalemia, dilated common bile duct with gallstones,   9/16 K 3.2,  Mag 1.9 - KCL 92meq IV x 3  And NS w/ KCL 72meq at 75 ml/hr. 9/17 K 3.7  Goal of Therapy:  electrolytes WNL  Plan:  K WNL, no supplementation needed at this time. Noted orders for pt discharge  Jasmine Stokes L 07/07/2017,10:33 AM

## 2017-07-07 NOTE — Discharge Summary (Addendum)
[facility] NAME: Jasmine Stokes    MR#:  [id number]  DATE OF BIRTH:  1918/11/20  DATE OF ADMISSION:  07/06/2017 ADMITTING PHYSICIAN: Bettey Costa, MD  DATE OF DISCHARGE: 07/07/2017   PRIMARY CARE PHYSICIAN: Madelyn Brunner, MD    ADMISSION DIAGNOSIS:  Edema [R60.9]  Cellulitis of left lower extremity [X32.355] Abdominal pain [R10.9]  DISCHARGE DIAGNOSIS:  Active Problems:   Abdominal pain   Pressure injury of skin   SECONDARY DIAGNOSIS:   Past Medical History:  Diagnosis Date  . Cancer ([place])   . Hypertension     HOSPITAL COURSE:   83 year old female with history of hypertension who presents with abdominal pain and left lower extremity cellulitis.  1. Abdominal pain: Patient's abdominal pain actually subsided after bowel movement. There was concern the patient may have acute cholecystitis. After reviewing MRCP does not appear the patient has acute cholecystitis. GI and surgery evaluations are appreciated.  2. Left lower extremity cellulitis: X-ray did not show evidence of osteomyelitis. Lower extremity Doppler did not show evidence of DVT. She needs to elevate the leg. Continue wound care. Continue antibiotics. I have changed Zosyn to clindamycin. Cleanse wound to left lower leg with Ns and pat dry.  Apply Mupirocin ointment twice daily.  Cover with dry dressing.   3. Essential hypertension:Patient will continue metoprolol, Norvasc and Lasix 4. Hypokalemia: This was repleted and has normalized  5. History of breast cancer: Continue Arimidex    DISCHARGE CONDITIONS AND DIET:  Stable Regular diet  CONSULTS OBTAINED:  Treatment Team:  Jules Husbands, MD Thornton Park, MD Lucilla Lame, MD  DRUG ALLERGIES:  No Known Allergies  DISCHARGE MEDICATIONS:   Current Discharge Medication List    START taking these medications   Details  clindamycin (CLEOCIN) 300 MG capsule Take 1 capsule (300 mg  total) by mouth every 8 (eight) hours. Qty: 18 capsule, Refills: 0    mupirocin cream (BACTROBAN) 2 % Apply topically 2 (two) times daily. Qty: 15 g, Refills: 0      CONTINUE these medications which have CHANGED   Details  ALPRAZolam (XANAX) 0.25 MG tablet Take 1-2 tablets (0.25-0.5 mg total) by mouth at bedtime as needed for anxiety. Qty: 30 tablet, Refills: 0      CONTINUE these medications which have NOT CHANGED   Details  amLODipine (NORVASC) 2.5 MG tablet Take 1 tablet by mouth daily.    anastrozole (ARIMIDEX) 1 MG tablet Take 1 tablet by mouth daily. Reported on 03/01/2016    docusate sodium (COLACE) 100 MG capsule Take 1 capsule (100 mg total) by mouth 2 (two) times daily. Qty: 10 capsule, Refills: 0    furosemide (LASIX) 20 MG tablet Take 1 tablet by mouth every other day.    alum & mag hydroxide-simeth (MAALOX/MYLANTA) 200-200-20 MG/5ML suspension Take 30 mLs by mouth every 4 (four) hours as needed for indigestion. Qty: 355 mL, Refills: 0    bisacodyl (DULCOLAX) 10 MG suppository Place 1 suppository (10 mg total) rectally daily as needed for moderate constipation. Qty: 12 suppository, Refills: 0    feeding supplement, ENSURE ENLIVE, (ENSURE ENLIVE) LIQD Take 237 mLs by mouth 2 (two) times daily between meals. Qty: 237 mL, Refills: 12    metoprolol tartrate (LOPRESSOR) 25 MG tablet Take 0.5 tablets (12.5 mg total) by mouth 2 (two) times daily. Qty: 60 tablet, Refills: 5    polyethylene glycol (MIRALAX / GLYCOLAX) packet Take 17 g by mouth daily  as needed for mild constipation. Qty: 14 each, Refills: 0    senna (SENOKOT) 8.6 MG TABS tablet Take 1 tablet (8.6 mg total) by mouth 2 (two) times daily. Qty: 120 each, Refills: 0    Vitamin D, Ergocalciferol, (DRISDOL) 50000 units CAPS capsule Take 1 capsule by mouth once a week. Reported on 03/01/2016      STOP taking these medications     enoxaparin (LOVENOX) 30 MG/0.3ML injection      ferrous sulfate 325 (65 FE) MG  tablet      ibuprofen (ADVIL,MOTRIN) 200 MG tablet      oxyCODONE (OXY IR/ROXICODONE) 5 MG immediate release tablet           Today   CHIEF COMPLAINT:  No issues overnight   VITAL SIGNS:  Blood pressure (!) 130/54, pulse 82, temperature 98.5 F (36.9 C), temperature source Oral, resp. rate 12, height 5\' 2"  (1.575 m), weight 40.2 kg (88 lb 11.2 oz), SpO2 93 %.   REVIEW OF SYSTEMS:  Review of Systems  Constitutional: Negative.  Negative for chills, fever and malaise/fatigue.  HENT: Negative.  Negative for ear discharge, ear pain, hearing loss, nosebleeds and sore throat.   Eyes: Negative.  Negative for blurred vision and pain.  Respiratory: Negative.  Negative for cough, hemoptysis, shortness of breath and wheezing.   Cardiovascular: Negative.  Negative for chest pain, palpitations and leg swelling.  Gastrointestinal: Negative.  Negative for abdominal pain, blood in stool, diarrhea, nausea and vomiting.  Genitourinary: Negative.  Negative for dysuria.  Musculoskeletal: Negative.  Negative for back pain.  Skin:       cellulitis  Neurological: Negative for dizziness, tremors, speech change, focal weakness, seizures and headaches.  Endo/Heme/Allergies: Negative.  Does not bruise/bleed easily.  Psychiatric/Behavioral: Negative.  Negative for depression, hallucinations and suicidal ideas.     PHYSICAL EXAMINATION:  GENERAL:  83 y.o.-year-old patient lying in the bed with no acute distress.  NECK:  Supple, no jugular venous distention. No thyroid enlargement, no tenderness.  LUNGS: Normal breath sounds bilaterally, no wheezing, rales,rhonchi  No use of accessory muscles of respiration.  CARDIOVASCULAR: S1, S2 normal. No murmurs, rubs, or gallops.  ABDOMEN: Soft, non-tender, non-distended. Bowel sounds present. No organomegaly or mass.  EXTREMITIES: No pedal edema, cyanosis, or clubbing.  PSYCHIATRIC: The patient is alert and oriented x 3.  SKIN: left leg cellulitis with wound  on shin  DATA REVIEW:   CBC  Recent Labs Lab 07/07/17 0319  WBC 6.9  HGB 11.9*  HCT 34.5*  PLT 214    Chemistries   Recent Labs Lab 07/06/17 0316 07/07/17 0319  NA 139 140  K 3.2* 3.7  CL 101 104  CO2 27 27  GLUCOSE 111* 88  BUN 29* 21*  CREATININE 0.99 0.92  CALCIUM 9.3 8.5*  MG 1.9  --   AST 37 34  ALT 26 20  ALKPHOS 109 86  BILITOT 1.5* 1.6*    Cardiac Enzymes No results for input(s): TROPONINI in the last 168 hours.  Microbiology Results  @[email or web address] @  RADIOLOGY:  Ct Abdomen Pelvis W Contrast  Result Date: 07/06/2017 CLINICAL DATA:  Right-sided abdominal pain. Abdominal distension. Fall last week. EXAM: CT ABDOMEN AND PELVIS WITH CONTRAST TECHNIQUE: Multidetector CT imaging of the abdomen and pelvis was performed using the standard protocol following bolus administration of intravenous contrast. CONTRAST:  35mL ISOVUE-300 IOPAMIDOL (ISOVUE-300) INJECTION 61% COMPARISON:  PET-CT 04/04/2011 FINDINGS: Lower chest: Motion artifact. Mild basilar atelectasis. Heart size upper normal. There are coronary  artery calcifications. Hepatobiliary: Multiple gallstones including a 12 mm stone in the gallbladder neck. There is mild intrahepatic biliary ductal dilatation, the common bile duct measures 8 mm, may be normal for age. No calcified choledocholithiasis. No focal hepatic lesion. Pancreas: No ductal dilatation or inflammation. Spleen: Normal in size without focal abnormality. Splenule anteriorly. Adrenals/Urinary Tract: Mild left adrenal thickening. Right adrenal gland is normal. No hydronephrosis or perinephric edema. Small low-density lesions within both kidneys are incompletely characterized due to size but likely small cysts. Urinary bladder is distended. No bladder wall thickening. Stomach/Bowel: Lack of enteric contrast and paucity of intra-abdominal fat limits bowel assessment. Stomach is nondistended. No evidence of bowel inflammation or wall thickening. Fecalization  of small bowel contents suggesting slow transit. Few prominent air-filled pelvic small bowel loops. Colonic tortuosity with moderate diffuse colonic stool burden. Appendix not confidently visualized. Vascular/Lymphatic: Mild aortic atherosclerosis without aneurysm. No adenopathy. Reproductive: Atrophic uterus, normal for age. 2.7 cm left adnexal structure is unchanged from 2012 examine presumed benign. Other: No intra-abdominal free fluid or free air. Musculoskeletal: Compression fractures at T11 and L1, likely chronic. Bones are diffusely under mineralized. Postsurgical change in the right proximal femur. No acute fracture. IMPRESSION: 1. Cholelithiasis. Intrahepatic biliary ductal dilatation with prominent common bile duct, no definite choledocholithiasis. Given right-sided pain, recommend right upper quadrant ultrasound. 2. Moderate colonic stool burden with colonic tortuosity and fecalization of small bowel contents suggesting slow transit. Few prominent air-filled small bowel loops in the lower abdomen, likely mild ileus. 3. Compression fractures of T11 and L1, technically age indeterminate but likely chronic. 4. Aortic Atherosclerosis (ICD10-I70.0). Coronary artery calcifications. Electronically Signed   By: Jeb Levering M.D.   On: 07/06/2017 05:44   US Venous Img Lower Unilateral Left  Result Date: 07/06/2017 CLINICAL DATA:  Edema. EXAM: LEFT LOWER EXTREMITY VENOUS DOPPLER ULTRASOUND TECHNIQUE: Gray-scale sonography with graded compression, as well as color Doppler and duplex ultrasound were performed to evaluate the lower extremity deep venous systems from the level of the common femoral vein and including the common femoral, femoral, profunda femoral, popliteal and calf veins including the posterior tibial, peroneal and gastrocnemius veins when visible. The superficial great saphenous vein was also interrogated. Spectral Doppler was utilized to evaluate flow at rest and with distal augmentation  maneuvers in the common femoral, femoral and popliteal veins. COMPARISON:  None. FINDINGS: Contralateral Common Femoral Vein: Respiratory phasicity is normal and symmetric with the symptomatic side. No evidence of thrombus. Normal compressibility. Common Femoral Vein: No evidence of thrombus. Normal compressibility, respiratory phasicity and response to augmentation. Saphenofemoral Junction: No evidence of thrombus. Normal compressibility and flow on color Doppler imaging. Profunda Femoral Vein: No evidence of thrombus. Normal compressibility and flow on color Doppler imaging. Femoral Vein: No evidence of thrombus. Normal compressibility, respiratory phasicity and response to augmentation. Popliteal Vein: No evidence of thrombus. Normal compressibility, respiratory phasicity and response to augmentation. Calf Veins: No evidence of thrombus. Normal compressibility and flow on color Doppler imaging. Superficial Great Saphenous Vein: No evidence of thrombus. Normal compressibility and flow on color Doppler imaging. Venous Reflux:  None. Other Findings:  None. IMPRESSION: No evidence of DVT within the left lower extremity. Soft tissue edema. Electronically Signed   By: Dorise Bullion III M.D   On: 07/06/2017 09:37   Dg Tibia/fibula Left Port  Result Date: 07/06/2017 CLINICAL DATA:  Fall last week.  Pain. EXAM: PORTABLE LEFT TIBIA AND FIBULA - 2 VIEW COMPARISON:  None. FINDINGS: Diffuse soft tissue edema. No fractures identified.  No bony erosions. No other acute abnormalities. IMPRESSION: Soft tissue swelling.  No underlying bony abnormality. Electronically Signed   By: Dorise Bullion III M.D   On: 07/06/2017 09:36   Mr Abdomen With Mrcp W Contrast  Result Date: 07/06/2017 CLINICAL DATA:  Cholelithiasis.  Abdominal pain starting 07/05/2017 EXAM: MRI ABDOMEN WITHOUT CONTRAST  (INCLUDING MRCP) TECHNIQUE: Multiplanar multisequence MR imaging of the abdomen was performed. Heavily T2-weighted images of the biliary  and pancreatic ducts were obtained, and three-dimensional MRCP images were rendered by post processing. COMPARISON:  Multiple exams, including CT and ultrasound exams from 07/06/2017 FINDINGS: Despite efforts by the technologist and patient, motion artifact is present on today's exam and could not be eliminated. This reduces exam sensitivity and specificity. Lower chest: Trace bilateral pleural effusions. Hepatobiliary: Two visualized gallstones in the gallbladder, the larger measuring 2.1 cm in long axis. Common bile duct caliber is estimated at 8 mm, which is within normal limits for the patient's age. I do not see a definite distal CBD stone although the dedicated MR CTs are essentially nondiagnostic due to motion artifact and other sequences are severely degraded. There is only borderline intrahepatic biliary dilatation. No focal abnormal hepatic lesion identified. I do not see definite abnormal enhancement along the biliary tree. Pancreas: No significant pancreatic duct dilatation. No obvious pancreatic lesion although the pancreas is obscured by motion artifact on most sequences. Spleen:  Grossly unremarkable Adrenals/Urinary Tract: Suspected small right kidney upper pole cyst on the T2 weighted images. Adrenal glands normal. Stomach/Bowel: Unremarkable where seen in the upper abdomen. Vascular/Lymphatic: Aortoiliac atherosclerotic vascular disease. Patent celiac and superior mesenteric artery. No pathologic adenopathy identified. Other:  No supplemental non-categorized findings. Musculoskeletal: Compression fractures of T11 and L1. IMPRESSION: 1. Severe degradation of images due to motion artifact. In general, successful MRCP requires patient capable of breath holds and remaining motionless. Markedly reduced sensitivity and specificity. 2. We do visualize 2 gallstones in the gallbladder. Common bile duct caliber at 8 mm is within normal limits given the patient's age of 67 years. There is only borderline  intrahepatic biliary dilatation. I do not see an obvious filling defect in the common bile duct, subject to limitations from severe motion artifact making the MRCP sequences nondiagnostic, and relying on other sequences to fill in the information. 3. Trace bilateral pleural effusions. 4.  Aortic Atherosclerosis (ICD10-I70.0). 5. Compression fractures at T11 and L1. Electronically Signed   By: Van Clines M.D.   On: 07/06/2017 16:36   US Abdomen Limited Ruq  Result Date: 07/06/2017 CLINICAL DATA:  Abdominal pain for 1 day. EXAM: ULTRASOUND ABDOMEN LIMITED RIGHT UPPER QUADRANT COMPARISON:  CT scan from earlier today FINDINGS: Gallbladder: 2 stones are seen in the gallbladder with a 20 mm stone in the fundus and a non mobile 15 mm stone in the neck. No gallbladder wall thickening, pericholecystic fluid, or Murphy's sign. Common bile duct: Diameter: 10 mm Liver: Mild intrahepatic ductal dilatation. No focal mass. Portal vein is patent on color Doppler imaging with normal direction of blood flow towards the liver. IMPRESSION: 1. Two large stones are seen in the gallbladder with a 20 mm stone in the fundus and a 15 mm non mobile stone in the neck. However, no wall thickening, pericholecystic fluid, or Murphy's sign are identified. 2. The common bile duct measures 10 mm and there is central intrahepatic ductal dilatation. Findings are concerning for central obstruction. An MRCP could better evaluate. Electronically Signed   By: Dorise Bullion III M.D  On: 07/06/2017 07:44      Current Discharge Medication List    START taking these medications   Details  clindamycin (CLEOCIN) 300 MG capsule Take 1 capsule (300 mg total) by mouth every 8 (eight) hours. Qty: 18 capsule, Refills: 0    mupirocin cream (BACTROBAN) 2 % Apply topically 2 (two) times daily. Qty: 15 g, Refills: 0      CONTINUE these medications which have CHANGED   Details  ALPRAZolam (XANAX) 0.25 MG tablet Take 1-2 tablets (0.25-0.5  mg total) by mouth at bedtime as needed for anxiety. Qty: 30 tablet, Refills: 0      CONTINUE these medications which have NOT CHANGED   Details  amLODipine (NORVASC) 2.5 MG tablet Take 1 tablet by mouth daily.    anastrozole (ARIMIDEX) 1 MG tablet Take 1 tablet by mouth daily. Reported on 03/01/2016    docusate sodium (COLACE) 100 MG capsule Take 1 capsule (100 mg total) by mouth 2 (two) times daily. Qty: 10 capsule, Refills: 0    furosemide (LASIX) 20 MG tablet Take 1 tablet by mouth every other day.    alum & mag hydroxide-simeth (MAALOX/MYLANTA) 200-200-20 MG/5ML suspension Take 30 mLs by mouth every 4 (four) hours as needed for indigestion. Qty: 355 mL, Refills: 0    bisacodyl (DULCOLAX) 10 MG suppository Place 1 suppository (10 mg total) rectally daily as needed for moderate constipation. Qty: 12 suppository, Refills: 0    feeding supplement, ENSURE ENLIVE, (ENSURE ENLIVE) LIQD Take 237 mLs by mouth 2 (two) times daily between meals. Qty: 237 mL, Refills: 12    metoprolol tartrate (LOPRESSOR) 25 MG tablet Take 0.5 tablets (12.5 mg total) by mouth 2 (two) times daily. Qty: 60 tablet, Refills: 5    polyethylene glycol (MIRALAX / GLYCOLAX) packet Take 17 g by mouth daily as needed for mild constipation. Qty: 14 each, Refills: 0    senna (SENOKOT) 8.6 MG TABS tablet Take 1 tablet (8.6 mg total) by mouth 2 (two) times daily. Qty: 120 each, Refills: 0    Vitamin D, Ergocalciferol, (DRISDOL) 50000 units CAPS capsule Take 1 capsule by mouth once a week. Reported on 03/01/2016      STOP taking these medications     enoxaparin (LOVENOX) 30 MG/0.3ML injection      ferrous sulfate 325 (65 FE) MG tablet      ibuprofen (ADVIL,MOTRIN) 200 MG tablet      oxyCODONE (OXY IR/ROXICODONE) 5 MG immediate release tablet             Management plans discussed with the patient and she is in agreement. Stable for discharge   Patient should follow up with pcp  CODE STATUS:      Code Status Orders        Start     Ordered   07/06/17 0955  Do not attempt resuscitation (DNR)  Continuous    Question Answer Comment  In the event of cardiac or respiratory ARREST Do not call a "code blue"   In the event of cardiac or respiratory ARREST Do not perform Intubation, CPR, defibrillation or ACLS   In the event of cardiac or respiratory ARREST Use medication by any route, position, wound care, and other measures to relive pain and suffering. May use oxygen, suction and manual treatment of airway obstruction as needed for comfort.      07/06/17 0954    Code Status History    Date Active Date Inactive Code Status Order ID Comments User Context  03/01/2016  2:26 PM 03/02/2016  1:30 PM Full Code [id number]  Nicholes Mango, MD Inpatient   03/01/2016 12:42 PM 03/01/2016  2:26 PM Full Code [id number]  Thornton Park, MD ED      TOTAL TIME TAKING CARE OF THIS PATIENT: 37 minutes.    Note: This dictation was prepared with Dragon dictation along with smaller phrase technology. Any transcriptional errors that result from this process are unintentional.  Omario Ander M.D on 07/07/2017 at 11:27 AM  Between 7am to 6pm - Pager - [phone] After 6pm go to [email or web address] - password [email or web address]  Office  [phone]  CC: Primary care physician; Madelyn Brunner, MD

## 2017-07-11 ENCOUNTER — Emergency Department: Payer: Medicare Other

## 2017-07-11 ENCOUNTER — Encounter: Payer: Self-pay | Admitting: *Deleted

## 2017-07-11 ENCOUNTER — Emergency Department
Admission: EM | Admit: 2017-07-11 | Discharge: 2017-07-11 | Disposition: A | Discharge status: Skilled Nursing Facility | Payer: Medicare Other | Attending: Emergency Medicine | Admitting: Emergency Medicine

## 2017-07-11 DIAGNOSIS — K5900 Constipation, unspecified: Secondary | ICD-10-CM | POA: Diagnosis not present

## 2017-07-11 DIAGNOSIS — R109 Unspecified abdominal pain: Secondary | ICD-10-CM | POA: Diagnosis present

## 2017-07-11 DIAGNOSIS — I10 Essential (primary) hypertension: Secondary | ICD-10-CM | POA: Diagnosis not present

## 2017-07-11 DIAGNOSIS — Z859 Personal history of malignant neoplasm, unspecified: Secondary | ICD-10-CM | POA: Diagnosis not present

## 2017-07-11 DIAGNOSIS — K8020 Calculus of gallbladder without cholecystitis without obstruction: Secondary | ICD-10-CM | POA: Insufficient documentation

## 2017-07-11 DIAGNOSIS — Z79899 Other long term (current) drug therapy: Secondary | ICD-10-CM | POA: Diagnosis not present

## 2017-07-11 LAB — COMPREHENSIVE METABOLIC PANEL
ALBUMIN: 3.3 g/dL — AB (ref 3.5–5.0)
ALT: 17 U/L (ref 14–54)
ANION GAP: 8 (ref 5–15)
AST: 27 U/L (ref 15–41)
Alkaline Phosphatase: 117 U/L (ref 38–126)
BUN: 20 mg/dL (ref 6–20)
CHLORIDE: 104 mmol/L (ref 101–111)
CO2: 29 mmol/L (ref 22–32)
Calcium: 9 mg/dL (ref 8.9–10.3)
Creatinine, Ser: 0.97 mg/dL (ref 0.44–1.00)
GFR calc Af Amer: 55 mL/min — ABNORMAL LOW (ref >60)
GFR calc non Af Amer: 48 mL/min — ABNORMAL LOW (ref >60)
GLUCOSE: 113 mg/dL — AB (ref 65–99)
POTASSIUM: 3.7 mmol/L (ref 3.5–5.1)
SODIUM: 141 mmol/L (ref 135–145)
TOTAL PROTEIN: 6 g/dL — AB (ref 6.5–8.1)
Total Bilirubin: 0.7 mg/dL (ref 0.3–1.2)

## 2017-07-11 LAB — CBC
HEMATOCRIT: 40.4 % (ref 35.0–47.0)
HEMOGLOBIN: 13.7 g/dL (ref 12.0–16.0)
MCH: 28.8 pg (ref 26.0–34.0)
MCHC: 34 g/dL (ref 32.0–36.0)
MCV: 84.8 fL (ref 80.0–100.0)
Platelets: 215 10*3/uL (ref 150–440)
RBC: 4.76 MIL/uL (ref 3.80–5.20)
RDW: 14.4 % (ref 11.5–14.5)
WBC: 9.3 10*3/uL (ref 3.6–11.0)

## 2017-07-11 LAB — CULTURE, BLOOD (ROUTINE X 2)
CULTURE: NO GROWTH
Culture: NO GROWTH
SPECIAL REQUESTS: ADEQUATE
Special Requests: ADEQUATE

## 2017-07-11 LAB — LIPASE, BLOOD: LIPASE: 30 U/L (ref 11–51)

## 2017-07-11 LAB — LACTIC ACID, PLASMA: Lactic Acid, Venous: 1.4 mmol/L (ref 0.5–1.9)

## 2017-07-11 MED ORDER — LIDOCAINE HCL 2 % EX GEL
1.0000 "application " | Freq: Once | CUTANEOUS | Status: AC
  Administered 2017-07-11: 1 via URETHRAL
  Filled 2017-07-11: qty 5

## 2017-07-11 MED ORDER — IOPAMIDOL (ISOVUE-300) INJECTION 61%
75.0000 mL | Freq: Once | INTRAVENOUS | Status: AC | PRN
  Administered 2017-07-11: 75 mL via INTRAVENOUS

## 2017-07-11 MED ORDER — LIDOCAINE HCL 2 % EX GEL
CUTANEOUS | Status: AC
  Administered 2017-07-11: 1 via URETHRAL
  Filled 2017-07-11: qty 10

## 2017-07-11 NOTE — ED Notes (Signed)
Report given to [place] at [facility], per Decarla pt to be transported via EMS back to facility, niece aware of discharge

## 2017-07-11 NOTE — ED Notes (Signed)
Pt resting in bed, resp even and unlabored 

## 2017-07-11 NOTE — ED Notes (Signed)
Secretary aware of need for EMS transport to [facility]

## 2017-07-11 NOTE — ED Notes (Signed)
EMS at bedside for discharge transport

## 2017-07-11 NOTE — ED Notes (Signed)
Pt had BM after enema

## 2017-07-11 NOTE — ED Triage Notes (Signed)
Pt arrives via EMS from [facility], states abd pain and distention, pt arrives with distended abd, states last BM 2 days ago, per staff pt has hx of gallstones, awake and alert

## 2017-07-11 NOTE — ED Provider Notes (Signed)
[facility] Emergency Department Provider Note  ____________________________________________   First MD Initiated Contact with Patient 07/11/17 1101     (approximate)  I have reviewed the triage vital signs and the nursing notes.   HISTORY  Chief Complaint Abdominal Pain   HPI Jasmine Stokes is a 83 y.o. female who comes to the emergency Department with roughly 2 weeks of diffuse abdominal cramping moderate severity discomfort and constipation. She was seen in our emergency departmentlast week she was noted to have gallstones and she was admitted for an MRCP which was negative. She was discharged back to her nursing home but felt her symptoms never actually improved. She's had no fevers or chills. Her pain is constant aching. It is not postprandial. She also had a bowel movement yesterday although it was small and she feels that she is constipated. She is passing flatus.   Past Medical History:  Diagnosis Date  . Cancer ([place])   . Hypertension     Patient Active Problem List   Diagnosis Date Noted  . Pressure injury of skin 07/07/2017  . Abdominal pain 07/06/2017  . Essential hypertension 03/05/2016  . Closed fracture of nasal bone 03/05/2016  . Leg swelling 03/05/2016  . Sinus tachycardia 03/05/2016  . Acute posthemorrhagic anemia 03/05/2016  . History of blood transfusion 03/05/2016  . Swelling of lower extremity 03/05/2016  . Intertrochanteric fracture of right femur ([place]) 03/01/2016    Past Surgical History:  Procedure Laterality Date  . BREAST LUMPECTOMY     bilateral  . EYE SURGERY    . INTRAMEDULLARY (IM) NAIL INTERTROCHANTERIC Right 03/02/2016   Procedure: INTRAMEDULLARY (IM) NAIL INTERTROCHANTRIC;  Surgeon: Thornton Park, MD;  Location: [facility] ORS;  Service: Orthopedics;  Laterality: Right;    Prior to Admission medications   Medication Sig Start Date End Date Taking? Authorizing Provider  ALPRAZolam (XANAX) 0.25 MG tablet Take 1-2  tablets (0.25-0.5 mg total) by mouth at bedtime as needed for anxiety. 07/07/17   Bettey Costa, MD  alum & mag hydroxide-simeth (MAALOX/MYLANTA) 200-200-20 MG/5ML suspension Take 30 mLs by mouth every 4 (four) hours as needed for indigestion. 03/05/16   Theodoro Grist, MD  amLODipine (NORVASC) 2.5 MG tablet Take 1 tablet by mouth daily. 07/03/17   [provider]  anastrozole (ARIMIDEX) 1 MG tablet Take 1 tablet by mouth daily. Reported on 03/01/2016    [provider]  bisacodyl (DULCOLAX) 10 MG suppository Place 1 suppository (10 mg total) rectally daily as needed for moderate constipation. 03/05/16   Theodoro Grist, MD  clindamycin (CLEOCIN) 300 MG capsule Take 1 capsule (300 mg total) by mouth every 8 (eight) hours. 07/07/17 07/13/17  Bettey Costa, MD  docusate sodium (COLACE) 100 MG capsule Take 1 capsule (100 mg total) by mouth 2 (two) times daily. 03/05/16   Theodoro Grist, MD  feeding supplement, ENSURE ENLIVE, (ENSURE ENLIVE) LIQD Take 237 mLs by mouth 2 (two) times daily between meals. 03/05/16   Theodoro Grist, MD  furosemide (LASIX) 20 MG tablet Take 1 tablet by mouth every other day. 07/05/17   [provider]  metoprolol tartrate (LOPRESSOR) 25 MG tablet Take 0.5 tablets (12.5 mg total) by mouth 2 (two) times daily. 03/05/16   Theodoro Grist, MD  mupirocin cream (BACTROBAN) 2 % Apply topically 2 (two) times daily. 07/07/17   Bettey Costa, MD  polyethylene glycol (MIRALAX / GLYCOLAX) packet Take 17 g by mouth daily as needed for mild constipation. 03/05/16   Theodoro Grist, MD  senna (  SENOKOT) 8.6 MG TABS tablet Take 1 tablet (8.6 mg total) by mouth 2 (two) times daily. 03/05/16   Theodoro Grist, MD  Vitamin D, Ergocalciferol, (DRISDOL) 50000 units CAPS capsule Take 1 capsule by mouth once a week. Reported on 03/01/2016    [provider]    Allergies Patient has no known allergies.  History reviewed. No pertinent family history.  Social History Social History    Substance Use Topics  . Smoking status: Never Smoker  . Smokeless tobacco: Never Used  . Alcohol use No    Review of Systems Constitutional: No fever/chills Eyes: No visual changes. ENT: No sore throat. Cardiovascular: Denies chest pain. Respiratory: Denies shortness of breath. Gastrointestinal: Positive abdominal pain.  Positive nausea, no vomiting.  No diarrhea.  Positive constipation. Genitourinary: Negative for dysuria. Musculoskeletal: Negative for back pain. Skin: Negative for rash. Neurological: Negative for headaches, focal weakness or numbness.   ____________________________________________   PHYSICAL EXAM:  VITAL SIGNS: ED Triage Vitals  Enc Vitals Group     BP 07/11/17 1037 (!) 166/88     Pulse Rate 07/11/17 1037 97     Resp 07/11/17 1037 16     Temp 07/11/17 1037 98.8 F (37.1 C)     Temp Source 07/11/17 1037 Oral     SpO2 07/11/17 1037 94 %     Weight 07/11/17 1035 90 lb (40.8 kg)     Height 07/11/17 1035 5\' 2"  (1.575 m)     Head Circumference --      Peak Flow --      Pain Score 07/11/17 1034 6     Pain Loc --      Pain Edu? --      Excl. in [place]? --     Constitutional: Alert and oriented 4 no distress chronically ill-appearing Eyes: PERRL EOMI. Head: Atraumatic. Nose: No congestion/rhinnorhea. Mouth/Throat: No trismus Neck: No stridor.   Cardiovascular: Normal rate, regular rhythm. Grossly normal heart sounds.  Good peripheral circulation. Respiratory: Normal respiratory effort.  No retractions. Lungs CTAB and moving good air Gastrointestinal: Soft abdomen mild distention and mild diffuse tenderness with no focality no rebound or guarding no peritonitis Musculoskeletal: No lower extremity edema   Neurologic:  Normal speech and language. No gross focal neurologic deficits are appreciated. Skin:  Skin is warm, dry and intact. No rash noted. Psychiatric: Mood and affect are normal. Speech and behavior are  normal.    ____________________________________________   DIFFERENTIAL includes but not limited to  Constipation, small bowel surgeon, large bowel obstruction, appendicitis, diverticulitis, biliary colic, ____________________________________________   LABS (all labs ordered are listed, but only abnormal results are displayed)  Labs Reviewed  COMPREHENSIVE METABOLIC PANEL - Abnormal; Notable for the following:       Result Value   Glucose, Bld 113 (*)    Total Protein 6.0 (*)    Albumin 3.3 (*)    GFR calc non Af Amer 48 (*)    GFR calc Af Amer 55 (*)    All other components within normal limits  LIPASE, BLOOD  CBC  LACTIC ACID, PLASMA    Blood work reviewed by me shows chronic kidney disease but no acute disease noted __________________________________________  EKG   ____________________________________________  RADIOLOGY  Right upper quadrant ultrasound reviewed by me shows gallstones but no signs of cholecystitis CT scan of the abdomen and pelvis reviewed by me shows no acute surgical disease but does show significant constipation ____________________________________________   PROCEDURES  Procedure(s) performed: yes  -------------------------------------------------------------------------------------------------------------------  Fecal Disimpaction Procedure Note:  Performed by me:  Patient placed in the lateral recumbent position with knees drawn towards chest. Nurse present for patient support. Large amount of hard brown stool removed. No complications during procedure.   ------------------------------------------------------------------------------------------------------------------    Procedures  Critical Care performed: no  Observation: no ____________________________________________   INITIAL IMPRESSION / ASSESSMENT AND PLAN / ED COURSE  Pertinent labs & imaging results that were available during my care of the patient were reviewed by me  and considered in my medical decision making (see chart for details).  The patient arrives hemodynamically stable and chronically ill-appearing. She has had chronic symptoms which are unchanged from previous. On review of her chart she had a CT scan showing gallstones with no signs of cholecystitis. She then was admitted where she had a surgical consult was found to not be a surgical candidate. She had an MRCP which showed no obstruction. She then also had a GI consult was felt to not require an ERCP. I obtained a right upper quadrant ultrasound today to evaluate for progression of disease and possible cholecystitis. Even though the patient is on a surgical candidate she would possibly benefit from antibiotics. Fortunately her ultrasound is negative. I also obtained a CT scan to see if she were obstructed, however it shows nothing aside from constipation. I then verbally consented the patient for disimpaction and following disimpaction she had an enema and feels significant relief. At this point the patient is medically stable for outpatient management verbalizes understanding and agreement with the plan.      ____________________________________________   FINAL CLINICAL IMPRESSION(S) / ED DIAGNOSES  Final diagnoses:  Gallstones  Constipation, unspecified constipation type      NEW MEDICATIONS STARTED DURING THIS VISIT:  Discharge Medication List as of 07/11/2017  2:20 PM       Note:  This document was prepared using Dragon voice recognition software and may include unintentional dictation errors.     Darel Hong, MD 07/12/17 1446

## 2017-07-11 NOTE — ED Notes (Signed)
Lidocaine given to Dr. Mable Paris, at bedside for disimpaction

## 2017-07-11 NOTE — Discharge Instructions (Signed)
Please make an appointment to follow-up with your primary care physician this coming Monday for a reevaluation. Return to the emergency department sooner for any concerns.  It was a pleasure to take care of you today, and thank you for coming to our emergency department.  If you have any questions or concerns before leaving please ask the nurse to grab me and I'm more than happy to go through your aftercare instructions again.  If you were prescribed any opioid pain medication today such as Norco, Vicodin, Percocet, morphine, hydrocodone, or oxycodone please make sure you do not drive when you are taking this medication as it can alter your ability to drive safely.  If you have any concerns once you are home that you are not improving or are in fact getting worse before you can make it to your follow-up appointment, please do not hesitate to call 911 and come back for further evaluation.  Darel Hong, MD  Results for orders placed or performed during the hospital encounter of 07/11/17  Lipase, blood  Result Value Ref Range   Lipase 30 11 - 51 U/L  Comprehensive metabolic panel  Result Value Ref Range   Sodium 141 135 - 145 mmol/L   Potassium 3.7 3.5 - 5.1 mmol/L   Chloride 104 101 - 111 mmol/L   CO2 29 22 - 32 mmol/L   Glucose, Bld 113 (H) 65 - 99 mg/dL   BUN 20 6 - 20 mg/dL   Creatinine, Ser 0.97 0.44 - 1.00 mg/dL   Calcium 9.0 8.9 - 10.3 mg/dL   Total Protein 6.0 (L) 6.5 - 8.1 g/dL   Albumin 3.3 (L) 3.5 - 5.0 g/dL   AST 27 15 - 41 U/L   ALT 17 14 - 54 U/L   Alkaline Phosphatase 117 38 - 126 U/L   Total Bilirubin 0.7 0.3 - 1.2 mg/dL   GFR calc non Af Amer 48 (L) >60 mL/min   GFR calc Af Amer 55 (L) >60 mL/min   Anion gap 8 5 - 15  CBC  Result Value Ref Range   WBC 9.3 3.6 - 11.0 K/uL   RBC 4.76 3.80 - 5.20 MIL/uL   Hemoglobin 13.7 12.0 - 16.0 g/dL   HCT 40.4 35.0 - 47.0 %   MCV 84.8 80.0 - 100.0 fL   MCH 28.8 26.0 - 34.0 pg   MCHC 34.0 32.0 - 36.0 g/dL   RDW 14.4 11.5 -  14.5 %   Platelets 215 150 - 440 K/uL  Lactic acid, plasma  Result Value Ref Range   Lactic Acid, Venous 1.4 0.5 - 1.9 mmol/L   Ct Abdomen Pelvis W Contrast  Result Date: 07/11/2017 CLINICAL DATA:  Abd pain and distention, pt arrives with distended abd, states last BM 2 days ago,pt has hx of gallstones-pain is worse than prior scan 5 days ago^77mL ISOVUE-300 IOPAMIDOL (ISOVUE-300) INJECTION 61% EXAM: CT ABDOMEN AND PELVIS WITH CONTRAST TECHNIQUE: Multidetector CT imaging of the abdomen and pelvis was performed using the standard protocol following bolus administration of intravenous contrast. CONTRAST:  63mL ISOVUE-300 IOPAMIDOL (ISOVUE-300) INJECTION 61% COMPARISON:  CT abdomen dated 07/06/2017. FINDINGS: Lower chest: Small bilateral pleural effusions with adjacent compressive atelectasis. Hepatobiliary: No focal liver abnormality is seen. Multiple gallstones again noted, largest measuring approximately 2 cm. Gallbladder is mildly distended but otherwise unremarkable. No pericholecystic fluid or other secondary signs of acute cholecystitis. Stable common bile duct and intrahepatic bile duct dilatation. Common bile duct measures 10 mm diameter, similar to measurements  on recent ultrasounds of 07/11/2017 and 07/06/2017. No bile duct stone seen. Of note, the common bile duct was measured at 8 mm on interval MRCP which is within normal limits given patient's age. Pancreas: Unremarkable. No pancreatic ductal dilatation or surrounding inflammatory changes. Spleen: Normal in size without focal abnormality. Adrenals/Urinary Tract: Adrenal glands appear normal. Small right renal cyst. Kidneys otherwise unremarkable without suspicious mass, stone or hydronephrosis. No ureteral or bladder calculi identified. Bladder appears normal, mildly distended. Stomach/Bowel: No dilated large or small bowel loops. Large amount of stool and gas throughout the colon no focal bowel wall thickening or evidence of bowel wall  inflammation seen. Probable diverticulosis of the sigmoid colon without evidence of acute diverticulitis. Appendix appears normal. Vascular/Lymphatic: Aortic atherosclerosis. No enlarged abdominal or pelvic lymph nodes. Reproductive: Unremarkable. Other: No free fluid or abscess collections seen. No free intraperitoneal air. Musculoskeletal: Chronic appearing compression fracture deformities at the T11 and L1 levels, as described on the earlier CT, unchanged in the short-term interval. No acute appearing osseous abnormality. IMPRESSION: 1. Cholelithiasis without evidence of acute cholecystitis. Stable common bile duct and intrahepatic bile duct dilatation, likely within normal limits given patient's age as previously reported. 2. No evidence of bowel obstruction. Fairly large amount of stool and gas throughout the nondistended colon suggesting constipation. Scattered air-fluid levels within the nondistended small bowel, likely associated mild ileus. 3. Small bilateral pleural effusions with adjacent compressive atelectasis. 4. Aortic atherosclerosis. 5. Compression fractures of T11 and L1, stable in the short-term interval, of uncertain age but most likely chronic. Electronically Signed   By: Franki Cabot M.D.   On: 07/11/2017 12:53   Ct Abdomen Pelvis W Contrast  Result Date: 07/06/2017 CLINICAL DATA:  Right-sided abdominal pain. Abdominal distension. Fall last week. EXAM: CT ABDOMEN AND PELVIS WITH CONTRAST TECHNIQUE: Multidetector CT imaging of the abdomen and pelvis was performed using the standard protocol following bolus administration of intravenous contrast. CONTRAST:  1mL ISOVUE-300 IOPAMIDOL (ISOVUE-300) INJECTION 61% COMPARISON:  PET-CT 04/04/2011 FINDINGS: Lower chest: Motion artifact. Mild basilar atelectasis. Heart size upper normal. There are coronary artery calcifications. Hepatobiliary: Multiple gallstones including a 12 mm stone in the gallbladder neck. There is mild intrahepatic biliary  ductal dilatation, the common bile duct measures 8 mm, may be normal for age. No calcified choledocholithiasis. No focal hepatic lesion. Pancreas: No ductal dilatation or inflammation. Spleen: Normal in size without focal abnormality. Splenule anteriorly. Adrenals/Urinary Tract: Mild left adrenal thickening. Right adrenal gland is normal. No hydronephrosis or perinephric edema. Small low-density lesions within both kidneys are incompletely characterized due to size but likely small cysts. Urinary bladder is distended. No bladder wall thickening. Stomach/Bowel: Lack of enteric contrast and paucity of intra-abdominal fat limits bowel assessment. Stomach is nondistended. No evidence of bowel inflammation or wall thickening. Fecalization of small bowel contents suggesting slow transit. Few prominent air-filled pelvic small bowel loops. Colonic tortuosity with moderate diffuse colonic stool burden. Appendix not confidently visualized. Vascular/Lymphatic: Mild aortic atherosclerosis without aneurysm. No adenopathy. Reproductive: Atrophic uterus, normal for age. 2.7 cm left adnexal structure is unchanged from 2012 examine presumed benign. Other: No intra-abdominal free fluid or free air. Musculoskeletal: Compression fractures at T11 and L1, likely chronic. Bones are diffusely under mineralized. Postsurgical change in the right proximal femur. No acute fracture. IMPRESSION: 1. Cholelithiasis. Intrahepatic biliary ductal dilatation with prominent common bile duct, no definite choledocholithiasis. Given right-sided pain, recommend right upper quadrant ultrasound. 2. Moderate colonic stool burden with colonic tortuosity and fecalization of small bowel contents suggesting  slow transit. Few prominent air-filled small bowel loops in the lower abdomen, likely mild ileus. 3. Compression fractures of T11 and L1, technically age indeterminate but likely chronic. 4. Aortic Atherosclerosis (ICD10-I70.0). Coronary artery calcifications.  Electronically Signed   By: Jeb Levering M.D.   On: 07/06/2017 05:44   US Venous Img Lower Unilateral Left  Result Date: 07/06/2017 CLINICAL DATA:  Edema. EXAM: LEFT LOWER EXTREMITY VENOUS DOPPLER ULTRASOUND TECHNIQUE: Gray-scale sonography with graded compression, as well as color Doppler and duplex ultrasound were performed to evaluate the lower extremity deep venous systems from the level of the common femoral vein and including the common femoral, femoral, profunda femoral, popliteal and calf veins including the posterior tibial, peroneal and gastrocnemius veins when visible. The superficial great saphenous vein was also interrogated. Spectral Doppler was utilized to evaluate flow at rest and with distal augmentation maneuvers in the common femoral, femoral and popliteal veins. COMPARISON:  None. FINDINGS: Contralateral Common Femoral Vein: Respiratory phasicity is normal and symmetric with the symptomatic side. No evidence of thrombus. Normal compressibility. Common Femoral Vein: No evidence of thrombus. Normal compressibility, respiratory phasicity and response to augmentation. Saphenofemoral Junction: No evidence of thrombus. Normal compressibility and flow on color Doppler imaging. Profunda Femoral Vein: No evidence of thrombus. Normal compressibility and flow on color Doppler imaging. Femoral Vein: No evidence of thrombus. Normal compressibility, respiratory phasicity and response to augmentation. Popliteal Vein: No evidence of thrombus. Normal compressibility, respiratory phasicity and response to augmentation. Calf Veins: No evidence of thrombus. Normal compressibility and flow on color Doppler imaging. Superficial Great Saphenous Vein: No evidence of thrombus. Normal compressibility and flow on color Doppler imaging. Venous Reflux:  None. Other Findings:  None. IMPRESSION: No evidence of DVT within the left lower extremity. Soft tissue edema. Electronically Signed   By: Dorise Bullion III M.D    On: 07/06/2017 09:37   Dg Tibia/fibula Left Port  Result Date: 07/06/2017 CLINICAL DATA:  Fall last week.  Pain. EXAM: PORTABLE LEFT TIBIA AND FIBULA - 2 VIEW COMPARISON:  None. FINDINGS: Diffuse soft tissue edema. No fractures identified. No bony erosions. No other acute abnormalities. IMPRESSION: Soft tissue swelling.  No underlying bony abnormality. Electronically Signed   By: Dorise Bullion III M.D   On: 07/06/2017 09:36   Mr Abdomen With Mrcp W Contrast  Result Date: 07/06/2017 CLINICAL DATA:  Cholelithiasis.  Abdominal pain starting 07/05/2017 EXAM: MRI ABDOMEN WITHOUT CONTRAST  (INCLUDING MRCP) TECHNIQUE: Multiplanar multisequence MR imaging of the abdomen was performed. Heavily T2-weighted images of the biliary and pancreatic ducts were obtained, and three-dimensional MRCP images were rendered by post processing. COMPARISON:  Multiple exams, including CT and ultrasound exams from 07/06/2017 FINDINGS: Despite efforts by the technologist and patient, motion artifact is present on today's exam and could not be eliminated. This reduces exam sensitivity and specificity. Lower chest: Trace bilateral pleural effusions. Hepatobiliary: Two visualized gallstones in the gallbladder, the larger measuring 2.1 cm in long axis. Common bile duct caliber is estimated at 8 mm, which is within normal limits for the patient's age. I do not see a definite distal CBD stone although the dedicated MR CTs are essentially nondiagnostic due to motion artifact and other sequences are severely degraded. There is only borderline intrahepatic biliary dilatation. No focal abnormal hepatic lesion identified. I do not see definite abnormal enhancement along the biliary tree. Pancreas: No significant pancreatic duct dilatation. No obvious pancreatic lesion although the pancreas is obscured by motion artifact on most sequences. Spleen:  Grossly unremarkable  Adrenals/Urinary Tract: Suspected small right kidney upper pole cyst on the T2  weighted images. Adrenal glands normal. Stomach/Bowel: Unremarkable where seen in the upper abdomen. Vascular/Lymphatic: Aortoiliac atherosclerotic vascular disease. Patent celiac and superior mesenteric artery. No pathologic adenopathy identified. Other:  No supplemental non-categorized findings. Musculoskeletal: Compression fractures of T11 and L1. IMPRESSION: 1. Severe degradation of images due to motion artifact. In general, successful MRCP requires patient capable of breath holds and remaining motionless. Markedly reduced sensitivity and specificity. 2. We do visualize 2 gallstones in the gallbladder. Common bile duct caliber at 8 mm is within normal limits given the patient's age of 87 years. There is only borderline intrahepatic biliary dilatation. I do not see an obvious filling defect in the common bile duct, subject to limitations from severe motion artifact making the MRCP sequences nondiagnostic, and relying on other sequences to fill in the information. 3. Trace bilateral pleural effusions. 4.  Aortic Atherosclerosis (ICD10-I70.0). 5. Compression fractures at T11 and L1. Electronically Signed   By: Van Clines M.D.   On: 07/06/2017 16:36   US Abdomen Limited Ruq  Result Date: 07/11/2017 CLINICAL DATA:  Abdominal pain.  Known cholelithiasis. EXAM: ULTRASOUND ABDOMEN LIMITED RIGHT UPPER QUADRANT COMPARISON:  MRCP dated July 06, 2017. FINDINGS: Gallbladder: Cholelithiasis. No wall thickening. No sonographic Murphy sign noted by sonographer. Common bile duct: Diameter: 10 mm proximally, unchanged, and likely within normal limits given the patient's age. Liver: No focal lesion identified. Within normal limits in parenchymal echogenicity. Portal vein is patent on color Doppler imaging with normal direction of blood flow towards the liver. Small right pleural effusion. IMPRESSION: 1. Cholelithiasis without sonographic evidence of acute cholecystitis. 2. Small right pleural effusion.  Electronically Signed   By: Titus Dubin M.D.   On: 07/11/2017 12:15   US Abdomen Limited Ruq  Result Date: 07/06/2017 CLINICAL DATA:  Abdominal pain for 1 day. EXAM: ULTRASOUND ABDOMEN LIMITED RIGHT UPPER QUADRANT COMPARISON:  CT scan from earlier today FINDINGS: Gallbladder: 2 stones are seen in the gallbladder with a 20 mm stone in the fundus and a non mobile 15 mm stone in the neck. No gallbladder wall thickening, pericholecystic fluid, or Murphy's sign. Common bile duct: Diameter: 10 mm Liver: Mild intrahepatic ductal dilatation. No focal mass. Portal vein is patent on color Doppler imaging with normal direction of blood flow towards the liver. IMPRESSION: 1. Two large stones are seen in the gallbladder with a 20 mm stone in the fundus and a 15 mm non mobile stone in the neck. However, no wall thickening, pericholecystic fluid, or Murphy's sign are identified. 2. The common bile duct measures 10 mm and there is central intrahepatic ductal dilatation. Findings are concerning for central obstruction. An MRCP could better evaluate. Electronically Signed   By: Dorise Bullion III M.D   On: 07/06/2017 07:44

## 2017-07-11 NOTE — ED Notes (Signed)
Pt resting in bed, denies any needs at this time, resp even and unlabored

## 2017-07-11 NOTE — ED Notes (Signed)
Pt cleaned and dressed, awaiting EMS transport

## 2017-07-11 NOTE — ED Notes (Signed)
Enema given, pt resting in bed on bed pan, call bell within reach

## 2018-11-21 IMAGING — MR MR ABDOMEN W/ CM MRCP
13 of 20 series · 29 of 48 positions shown · IV contrast (multi)
Comparison: Multiple exams, including CT and ultrasound exams from
07/06/2017

CLINICAL DATA: Cholelithiasis.  Abdominal pain starting 07/05/2017

EXAM:
MRI ABDOMEN WITHOUT CONTRAST  (INCLUDING MRCP)
TECHNIQUE: Multiplanar multisequence MR imaging of the abdomen was performed.
Heavily T2-weighted images of the biliary and pancreatic ducts were
obtained, and three-dimensional MRCP images were rendered by post
processing.

[Series 4: cor tru fisp · coronal · 4.0mm · 0.74mm/px · 3 of 40 slices shown]
[im 1/40]
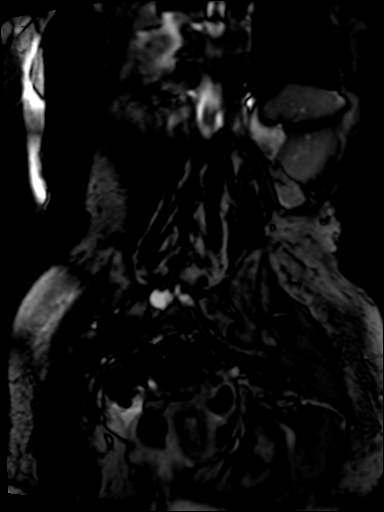
[im 20/40]
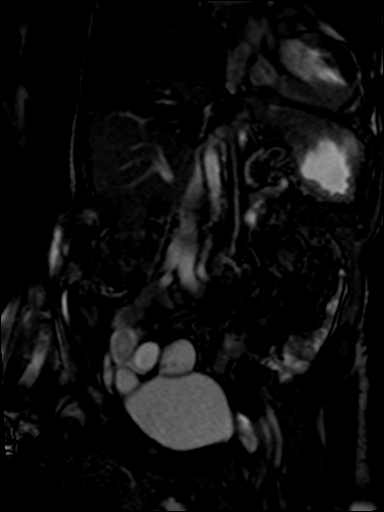
[im 40/40]
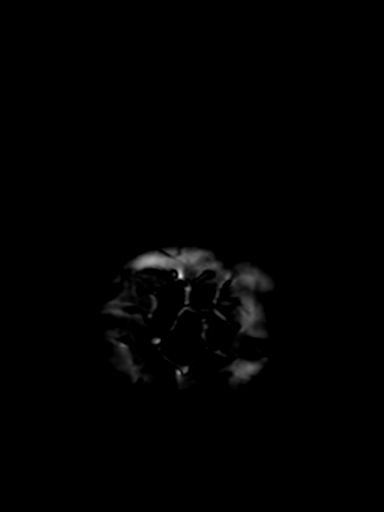

[Series 5: T2 fat-sat · axial · 7.0mm · 0.66mm/px · 1 of 19 slices shown]
[im 1/19]
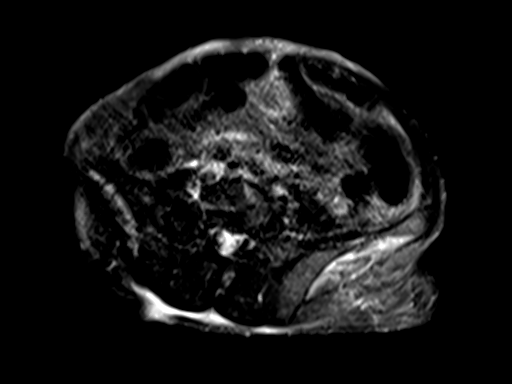

[Series 6: T2 · axial · 7.0mm · 0.66mm/px · 1 of 19 slices shown]
[im 1/19]
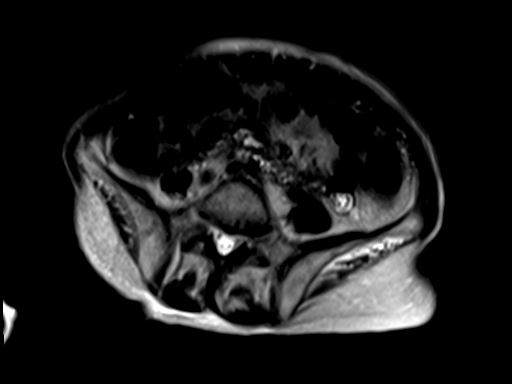

[Series 7: ax dual echo · axial · 7.0mm · 0.74mm/px · z∈[+62,+208]mm · 2 of 38 slices shown]
[im 1/38]
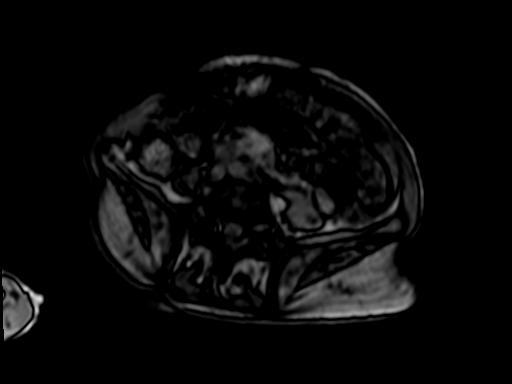
[im 38/38]
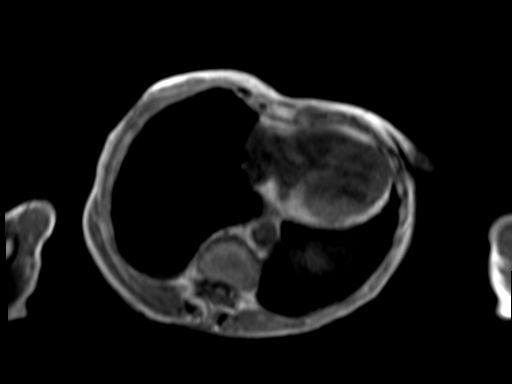

[Series 11: cor thins · coronal · 4.0mm · 0.89mm/px · 1 of 11 slices shown]
[im 1/11]
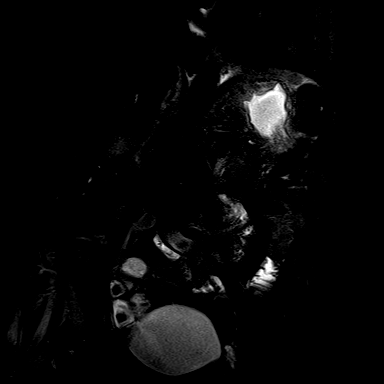

[Series 12: MRCP · coronal · 40.0mm · 0.89mm/px · 1 of 6 slices shown]
[im 1/6]
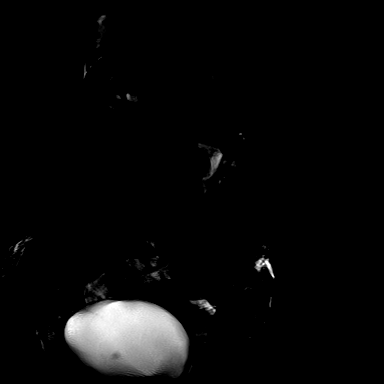

[Series 13: DWI · axial · 6.0mm · 1.77mm/px · z∈[+22,+237]mm · 4 of 96 slices shown]
[im 1/96]
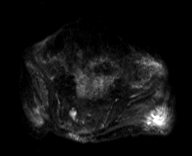
[im 32/96]
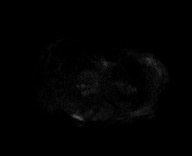
[im 64/96]
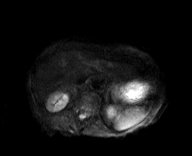
[im 96/96]
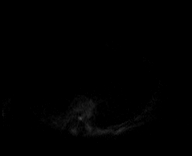

[Series 14: ax dwi_adc · axial · 6.0mm · 1.77mm/px · 1 of 32 slices shown]
[im 1/32]
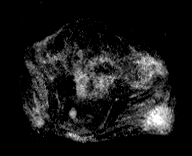

[Series 15: T1 dynamic fat-sat · axial · non-contrast · 2.5mm · 0.74mm/px · z∈[+26,+216]mm · 3 of 80 slices shown (1 of 4)]
[im 1/80]
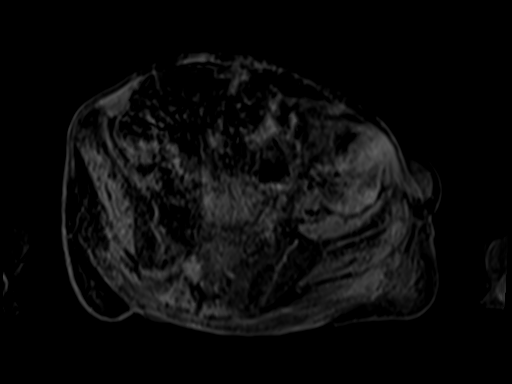
[im 40/80]
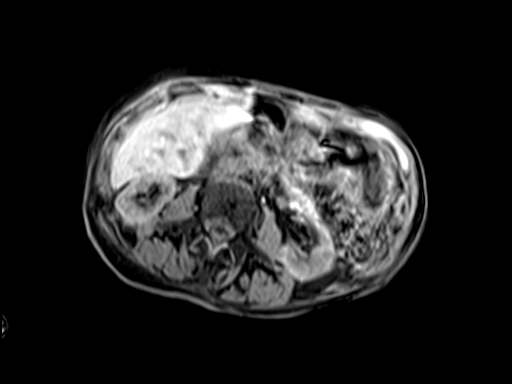
[im 80/80]
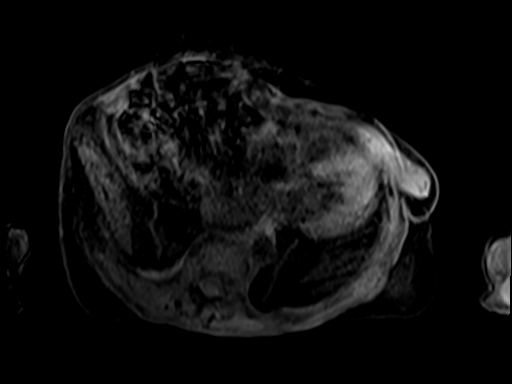

[Series 16: T1 dynamic fat-sat · axial · 2.5mm · 0.74mm/px · z∈[+26,+216]mm · 3 of 80 slices shown (2 of 4)]
[im 1/80]
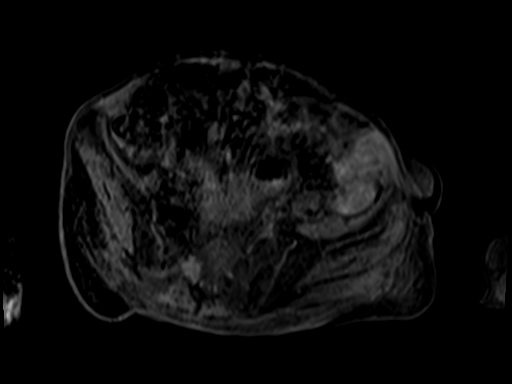
[im 40/80]
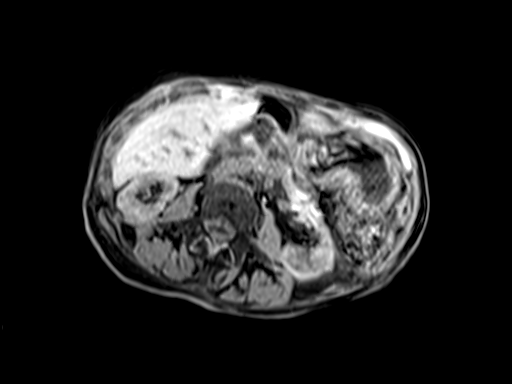
[im 80/80]
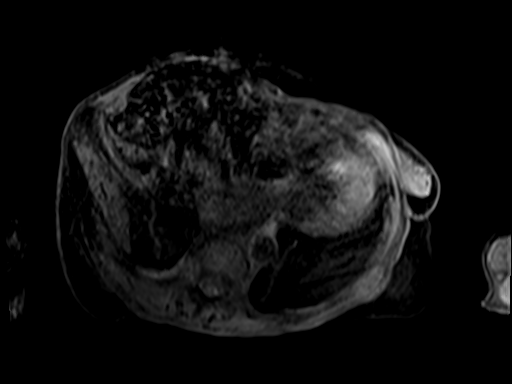

[Series 17: T1 dynamic fat-sat · axial · 2.5mm · 0.74mm/px · z∈[+26,+216]mm · 3 of 80 slices shown (3 of 4)]
[im 1/80]
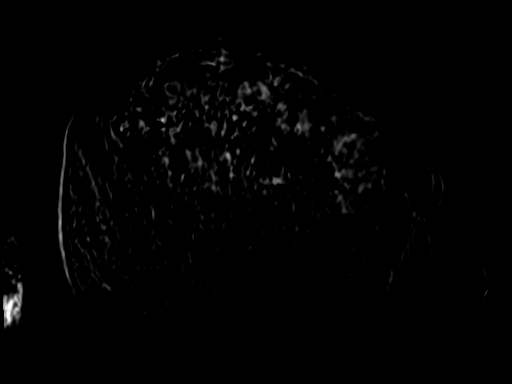
[im 40/80]
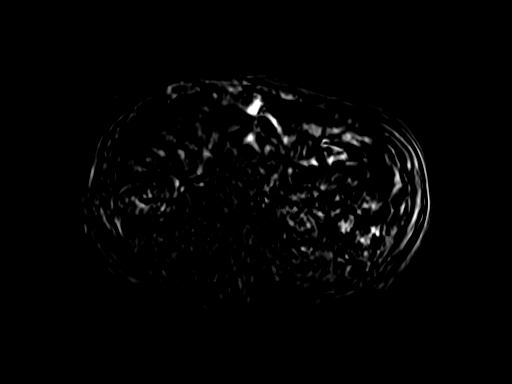
[im 80/80]
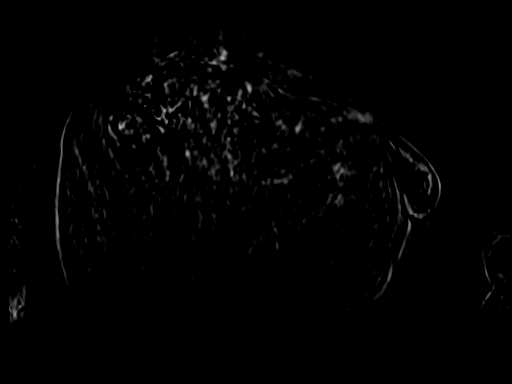

[Series 18: T1 dynamic fat-sat post-contrast · axial · 2.5mm · 0.74mm/px · z∈[+26,+216]mm · 3 of 80 slices shown]
[im 1/80]
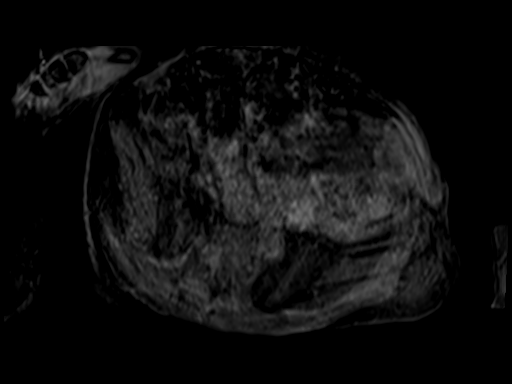
[im 40/80]
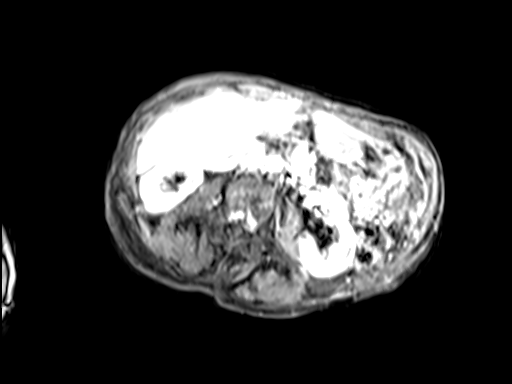
[im 80/80]
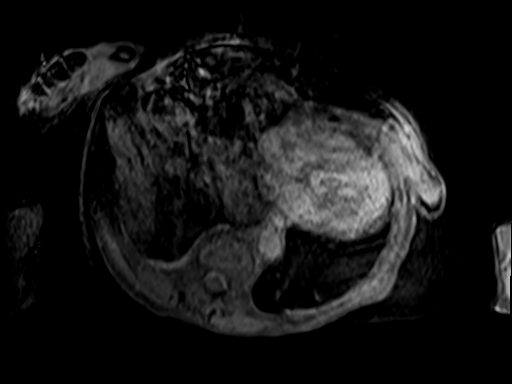

[Series 19: T1 dynamic fat-sat · axial · 2.5mm · 0.74mm/px · z∈[+26,+216]mm · 3 of 80 slices shown (4 of 4)]
[im 1/80]
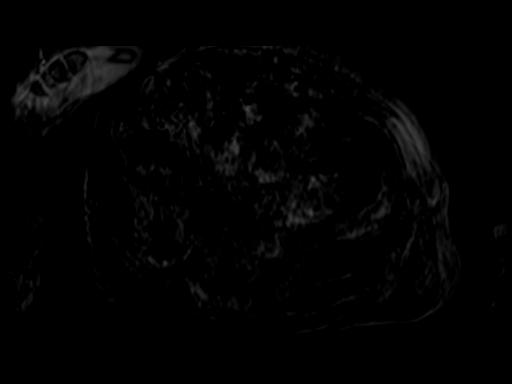
[im 40/80]
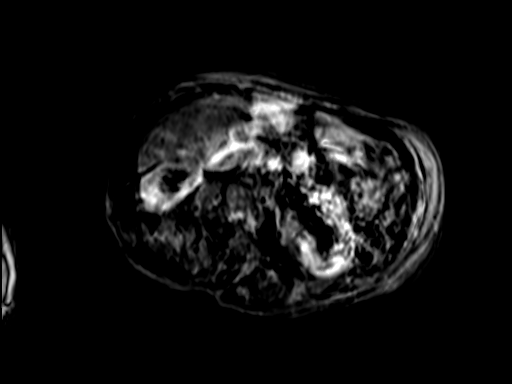
[im 80/80]
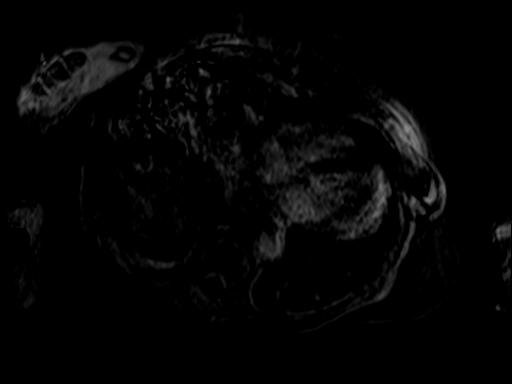

[29 of 48 positions shown; findings below may reference images not displayed]

FINDINGS: Despite efforts by the technologist and patient, motion artifact is
present on today's exam and could not be eliminated. This reduces
exam sensitivity and specificity.

Lower chest: Trace bilateral pleural effusions.

Hepatobiliary: Two visualized gallstones in the gallbladder, the
larger measuring 2.1 cm in long axis. Common bile duct caliber is
estimated at 8 mm, which is within normal limits for the patient's
age. I do not see a definite distal CBD stone although the dedicated
MR Lestari Santoso are essentially nondiagnostic due to motion artifact and
other sequences are severely degraded. There is only borderline
intrahepatic biliary dilatation. No focal abnormal hepatic lesion
identified. I do not see definite abnormal enhancement along the
biliary tree.

Pancreas: No significant pancreatic duct dilatation. No obvious
pancreatic lesion although the pancreas is obscured by motion
artifact on most sequences.

Spleen:  Grossly unremarkable

Adrenals/Urinary Tract: Suspected small right kidney upper pole cyst
on the T2 weighted images. Adrenal glands normal.

Stomach/Bowel: Unremarkable where seen in the upper abdomen.

Vascular/Lymphatic: Aortoiliac atherosclerotic vascular disease.
Patent celiac and superior mesenteric artery. No pathologic
adenopathy identified.

Other:  No supplemental non-categorized findings.

Musculoskeletal: Compression fractures of T11 and L1.
IMPRESSION: 1. Severe degradation of images due to motion artifact. In general,
successful MRCP requires patient capable of breath holds and
remaining motionless. Markedly reduced sensitivity and specificity.
2. We do visualize 2 gallstones in the gallbladder. Common bile duct
caliber at 8 mm is within normal limits given the patient's age of
[AGE]. There is only borderline intrahepatic biliary dilatation.
I do not see an obvious filling defect in the common bile duct,
subject to limitations from severe motion artifact making the MRCP
sequences nondiagnostic, and relying on other sequences to fill in
the information.
3. Trace bilateral pleural effusions.
4.  Aortic Atherosclerosis ([id number]).
5. Compression fractures at T11 and L1.

## 2018-11-23 ENCOUNTER — Other Ambulatory Visit
Admission: RE | Admit: 2018-11-23 | Discharge: 2018-11-23 | Disposition: A | Discharge status: Home / Self Care | Payer: Commercial Managed Care - HMO | Source: Ambulatory Visit | Attending: Family Medicine | Admitting: Family Medicine

## 2018-11-23 DIAGNOSIS — R300 Dysuria: Secondary | ICD-10-CM | POA: Insufficient documentation

## 2018-11-23 DIAGNOSIS — R4182 Altered mental status, unspecified: Secondary | ICD-10-CM | POA: Diagnosis not present

## 2018-11-23 LAB — URINALYSIS, ROUTINE W REFLEX MICROSCOPIC
Bilirubin Urine: NEGATIVE
Glucose, UA: NEGATIVE mg/dL
Ketones, ur: NEGATIVE mg/dL
Nitrite: POSITIVE — AB
PROTEIN: NEGATIVE mg/dL
Specific Gravity, Urine: 1.013 (ref 1.005–1.030)
pH: 5 (ref 5.0–8.0)

## 2018-11-23 LAB — CBC WITH DIFFERENTIAL/PLATELET
ABS IMMATURE GRANULOCYTES: 0.02 10*3/uL (ref 0.00–0.07)
BASOS PCT: 0 %
Basophils Absolute: 0 10*3/uL (ref 0.0–0.1)
Eosinophils Absolute: 0.2 10*3/uL (ref 0.0–0.5)
Eosinophils Relative: 3 %
HEMATOCRIT: 37.7 % (ref 36.0–46.0)
HEMOGLOBIN: 11.1 g/dL — AB (ref 12.0–15.0)
IMMATURE GRANULOCYTES: 0 %
LYMPHS ABS: 1.9 10*3/uL (ref 0.7–4.0)
Lymphocytes Relative: 23 %
MCH: 25.4 pg — ABNORMAL LOW (ref 26.0–34.0)
MCHC: 29.4 g/dL — ABNORMAL LOW (ref 30.0–36.0)
MCV: 86.3 fL (ref 80.0–100.0)
MONO ABS: 0.6 10*3/uL (ref 0.1–1.0)
MONOS PCT: 8 %
NEUTROS ABS: 5.3 10*3/uL (ref 1.7–7.7)
NEUTROS PCT: 66 %
PLATELETS: 299 10*3/uL (ref 150–400)
RBC: 4.37 MIL/uL (ref 3.87–5.11)
RDW: 14.5 % (ref 11.5–15.5)
WBC: 8 10*3/uL (ref 4.0–10.5)
nRBC: 0 % (ref 0.0–0.2)

## 2018-11-23 LAB — BASIC METABOLIC PANEL
Anion gap: 11 (ref 5–15)
BUN: 23 mg/dL (ref 8–23)
CO2: 31 mmol/L (ref 22–32)
Calcium: 9.4 mg/dL (ref 8.9–10.3)
Chloride: 101 mmol/L (ref 98–111)
Creatinine, Ser: 1.16 mg/dL — ABNORMAL HIGH (ref 0.44–1.00)
GFR calc Af Amer: 46 mL/min — ABNORMAL LOW (ref >60)
GFR, EST NON AFRICAN AMERICAN: 39 mL/min — AB (ref >60)
Glucose, Bld: 95 mg/dL (ref 70–99)
POTASSIUM: 4 mmol/L (ref 3.5–5.1)
SODIUM: 143 mmol/L (ref 135–145)

## 2018-11-25 LAB — URINE CULTURE

## 2018-12-20 DEATH — deceased

## 2019-01-26 IMAGING — US US EXTREM LOW VENOUS*L*
1 series · 13 of 24 positions shown · non-contrast
Comparison: None.

CLINICAL DATA: Edema.



[Series 1: us extrem low venous*left* · 0.06mm/px · 13 of 36 slices shown]
[im 1/36]
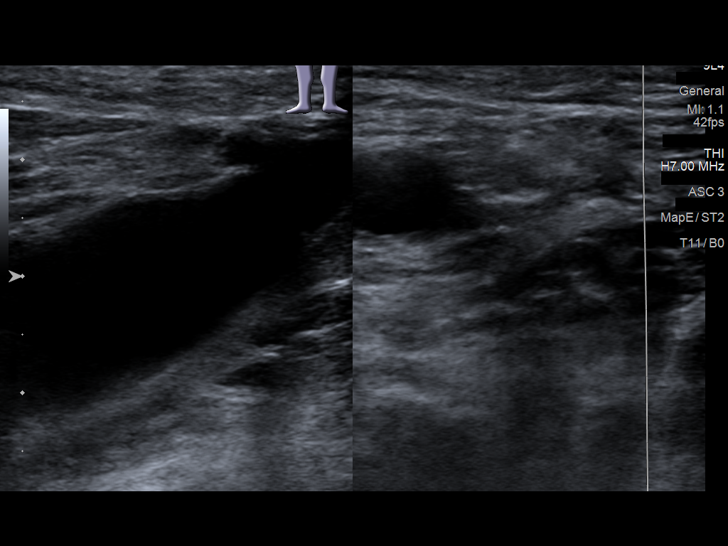
[im 4/36]
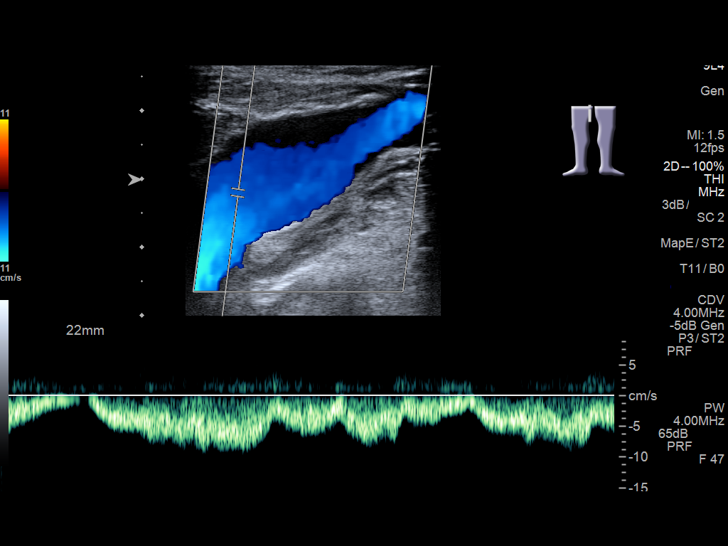
[im 7/36]
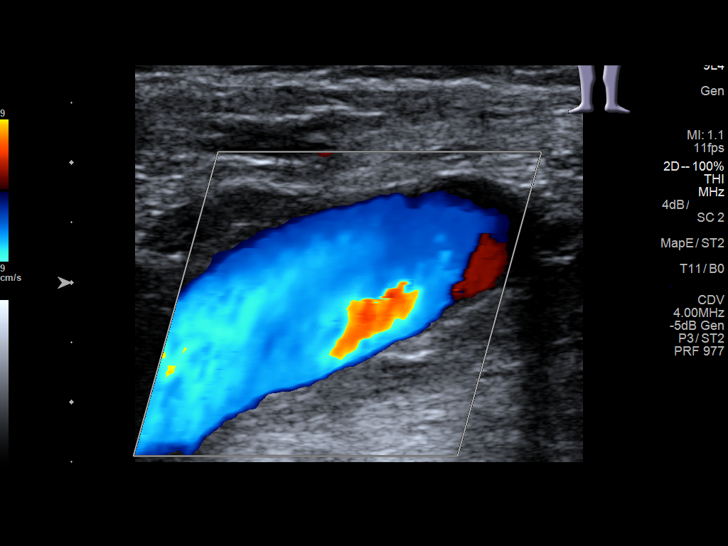
[im 10/36]
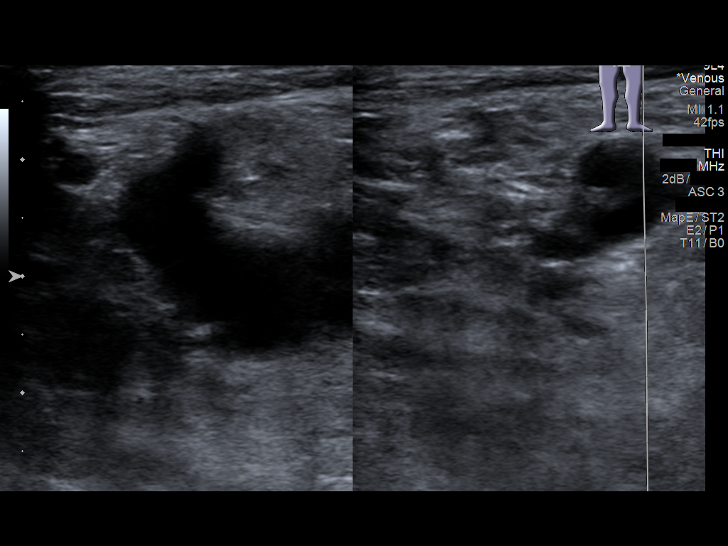
[im 13/36]
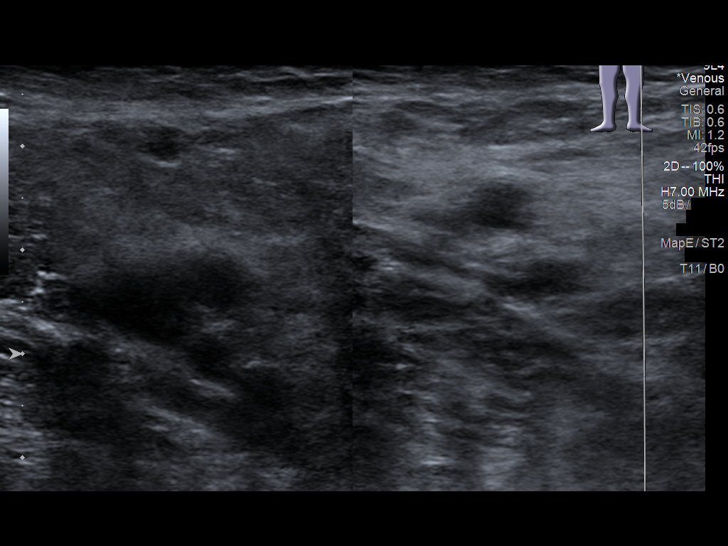
[im 16/36]
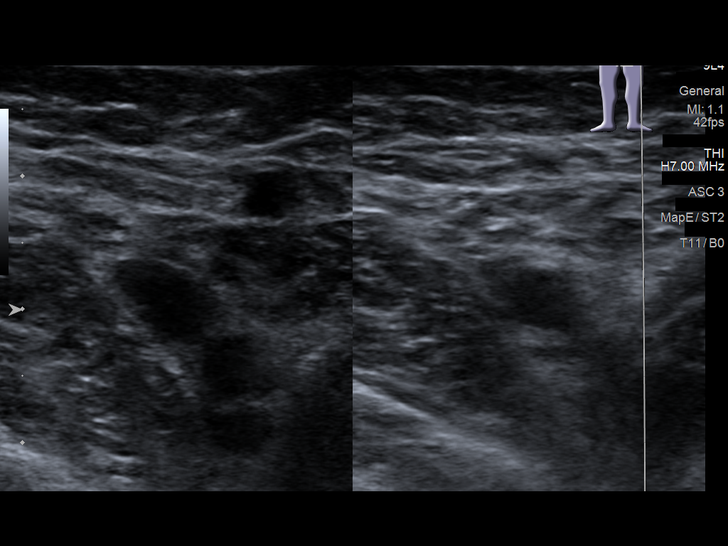
[im 19/36]
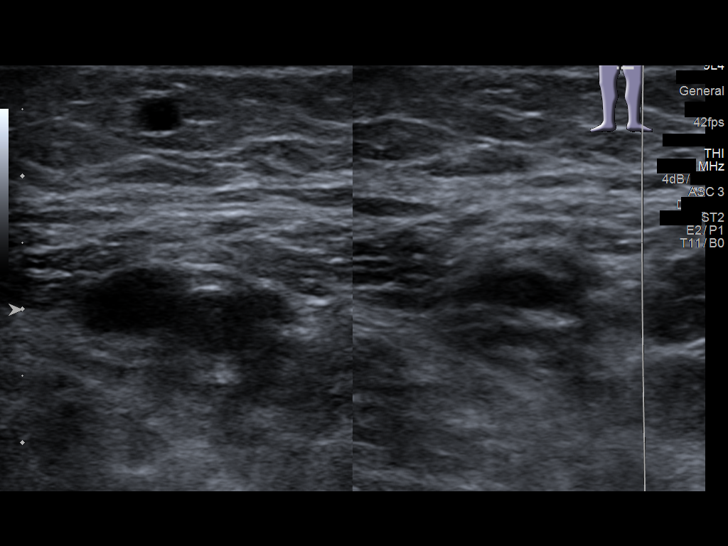
[im 20/36]
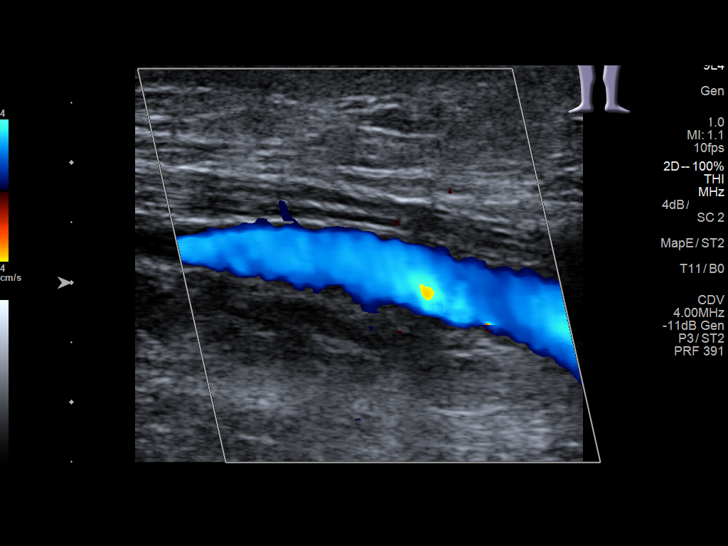
[im 23/36]
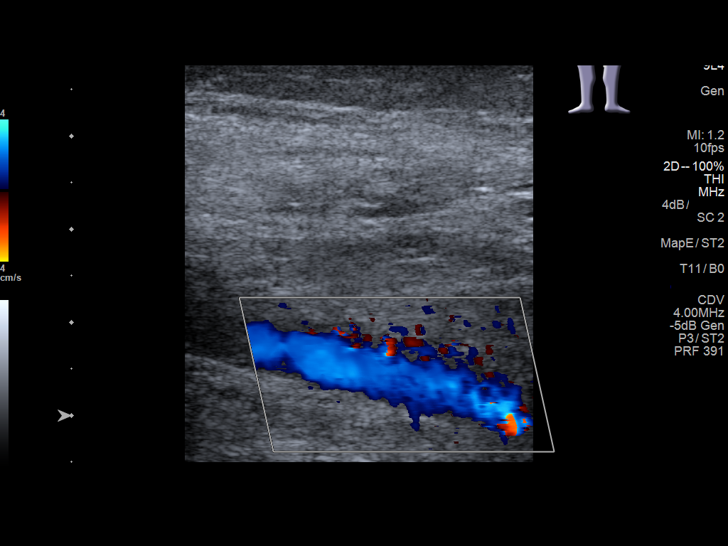
[im 26/36]
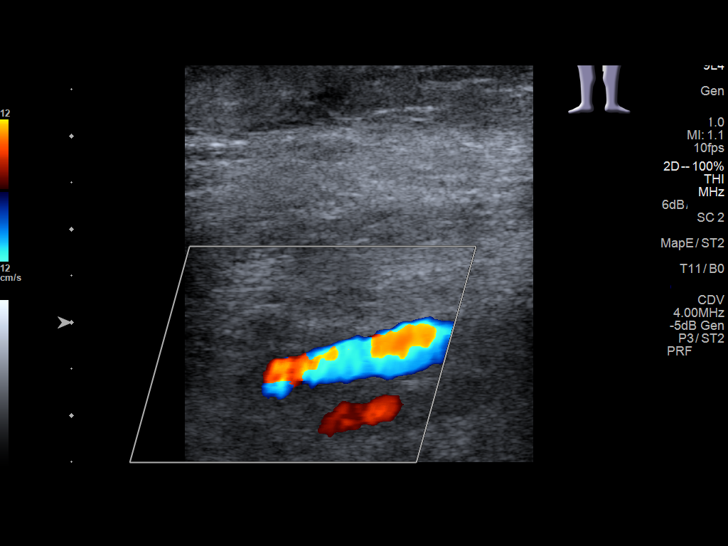
[im 29/36]
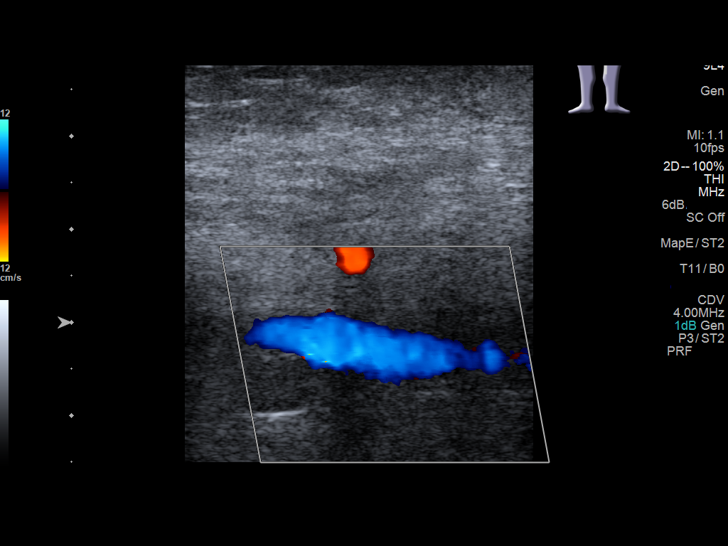
[im 32/36]
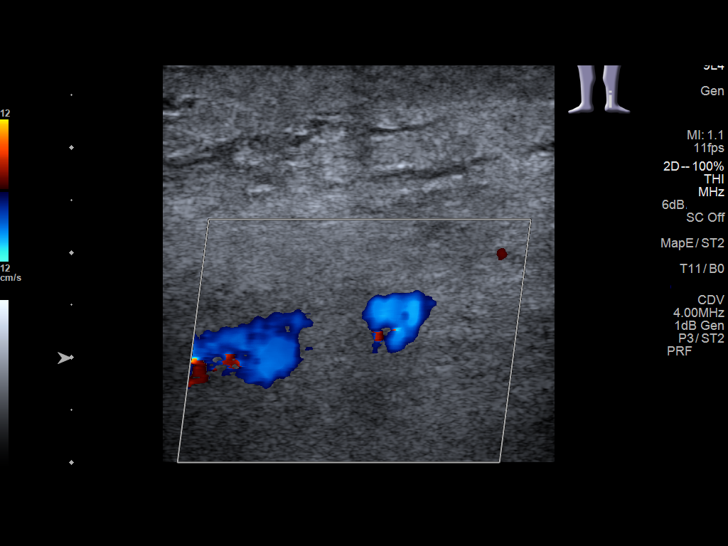
[im 36/36]
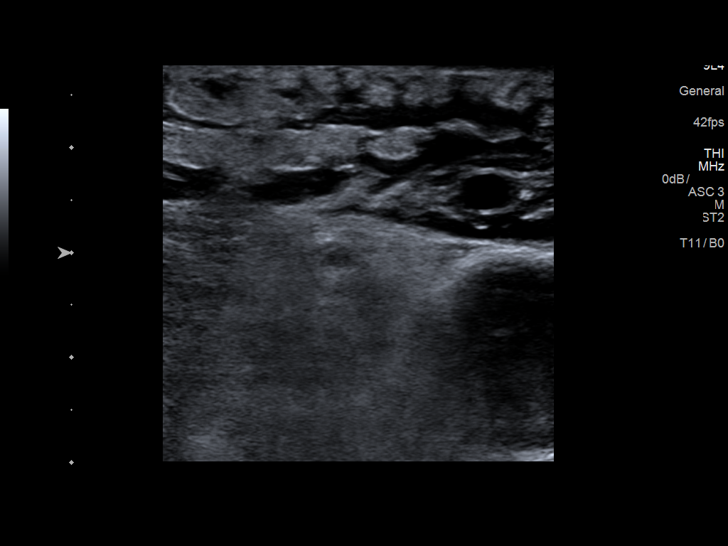

[13 of 24 positions shown; findings below may reference images not displayed]

FINDINGS: Contralateral Common Femoral Vein: Respiratory phasicity is normal
and symmetric with the symptomatic side. No evidence of thrombus.
Normal compressibility.

Common Femoral Vein: No evidence of thrombus. Normal
compressibility, respiratory phasicity and response to augmentation.

Saphenofemoral Junction: No evidence of thrombus. Normal
compressibility and flow on color Doppler imaging.

Profunda Femoral Vein: No evidence of thrombus. Normal
compressibility and flow on color Doppler imaging.

Femoral Vein: No evidence of thrombus. Normal compressibility,
respiratory phasicity and response to augmentation.

Popliteal Vein: No evidence of thrombus. Normal compressibility,
respiratory phasicity and response to augmentation.

Calf Veins: No evidence of thrombus. Normal compressibility and flow
on color Doppler imaging.

Superficial Great Saphenous Vein: No evidence of thrombus. Normal
compressibility and flow on color Doppler imaging.

Venous Reflux:  None.

Other Findings:  None.
IMPRESSION: No evidence of DVT within the left lower extremity. Soft tissue
edema.
